# Patient Record
Sex: Female | Born: 1975 | Race: Black or African American | Hispanic: No | Marital: Single | Smoking: Never smoker
Health system: Southern US, Community
[De-identification: ages and names within clinical notes are randomized; demographics above are authoritative.]

## PROBLEM LIST (undated history)

## (undated) DIAGNOSIS — I10 Essential (primary) hypertension: Secondary | ICD-10-CM

---

## 2014-03-14 ENCOUNTER — Emergency Department (HOSPITAL_COMMUNITY)
Admission: EM | Admit: 2014-03-14 | Discharge: 2014-03-14 | Disposition: A | Discharge status: Home / Self Care | Payer: Medicaid Other | Source: Home / Self Care | Attending: Family Medicine | Admitting: Family Medicine

## 2014-03-14 ENCOUNTER — Encounter (HOSPITAL_COMMUNITY): Payer: Self-pay | Admitting: Emergency Medicine

## 2014-03-14 DIAGNOSIS — R300 Dysuria: Secondary | ICD-10-CM

## 2014-03-14 DIAGNOSIS — R030 Elevated blood-pressure reading, without diagnosis of hypertension: Secondary | ICD-10-CM

## 2014-03-14 DIAGNOSIS — IMO0001 Reserved for inherently not codable concepts without codable children: Secondary | ICD-10-CM

## 2014-03-14 LAB — POCT URINALYSIS DIP (DEVICE)
Bilirubin Urine: NEGATIVE
Glucose, UA: NEGATIVE mg/dL
KETONES UR: NEGATIVE mg/dL
LEUKOCYTES UA: NEGATIVE
Nitrite: NEGATIVE
PH: 6 (ref 5.0–8.0)
PROTEIN: NEGATIVE mg/dL
Specific Gravity, Urine: >=1.03 (ref 1.005–1.030)
UROBILINOGEN UA: 0.2 mg/dL (ref 0.0–1.0)

## 2014-03-14 MED ORDER — AMLODIPINE BESYLATE 5 MG PO TABS: 5.0000 mg | ORAL_TABLET | Freq: Every day | ORAL | Status: DC

## 2014-03-14 MED ORDER — NITROFURANTOIN MONOHYD MACRO 100 MG PO CAPS: 100.0000 mg | ORAL_CAPSULE | Freq: Two times a day (BID) | ORAL | Status: DC

## 2014-03-14 NOTE — Discharge Instructions (Signed)
Please take your blood pressure medication every day. Use medication as prescribed for your discomfort with urination. If symptoms become worse, please follow up with your doctor or return here. If urine culture indicates need for additional treatment you will be notified by phone.   Dysuria Dysuria is the medical term for pain with urination. There are many causes for dysuria, but urinary tract infection is the most common. If a urinalysis was performed it can show that there is a urinary tract infection. A urine culture confirms that you or your child is sick. You will need to follow up with a healthcare provider because:  If a urine culture was done you will need to know the culture results and treatment recommendations.  If the urine culture was positive, you or your child will need to be put on antibiotics or know if the antibiotics prescribed are the right antibiotics for your urinary tract infection.  If the urine culture is negative (no urinary tract infection), then other causes may need to be explored or antibiotics need to be stopped. Today laboratory work may have been done and there does not seem to be an infection. If cultures were done they will take at least 24 to 48 hours to be completed. Today x-rays may have been taken and they read as normal. No cause can be found for the problems. The x-rays may be re-read by a radiologist and you will be contacted if additional findings are made. You or your child may have been put on medications to help with this problem until you can see your primary caregiver. If the problems get better, see your primary caregiver if the problems return. If you were given antibiotics (medications which kill germs), take all of the mediations as directed for the full course of treatment.  If laboratory work was done, you need to find the results. Leave a telephone number where you can be reached. If this is not possible, make sure you find out how you are to get  test results. HOME CARE INSTRUCTIONS   Drink lots of fluids. For adults, drink eight, 8 ounce glasses of clear juice or water a day. For children, replace fluids as suggested by your caregiver.  Empty the bladder often. Avoid holding urine for long periods of time.  After a bowel movement, women should cleanse front to back, using each tissue only once.  Empty your bladder before and after sexual intercourse.  Take all the medicine given to you until it is gone. You may feel better in a few days, but TAKE ALL MEDICINE.  Avoid caffeine, tea, alcohol and carbonated beverages, because they tend to irritate the bladder.  In men, alcohol may irritate the prostate.  Only take over-the-counter or prescription medicines for pain, discomfort, or fever as directed by your caregiver.  If your caregiver has given you a follow-up appointment, it is very important to keep that appointment. Not keeping the appointment could result in a chronic or permanent injury, pain, and disability. If there is any problem keeping the appointment, you must call back to this facility for assistance. SEEK IMMEDIATE MEDICAL CARE IF:   Back pain develops.  A fever develops.  There is nausea (feeling sick to your stomach) or vomiting (throwing up).  Problems are no better with medications or are getting worse. MAKE SURE YOU:   Understand these instructions.  Will watch your condition.  Will get help right away if you are not doing well or get worse. Document Released:  08/13/2004 Document Revised: 02/07/2012 Document Reviewed: 06/20/2008 [facility] Patient Information 2014 [place], [place].  Hypertension As your heart beats, it forces blood through your arteries. This force is your blood pressure. If the pressure is too high, it is called hypertension (HTN) or high blood pressure. HTN is dangerous because you may have it and not know it. High blood pressure may mean that your heart has to work harder to pump  blood. Your arteries may be narrow or stiff. The extra work puts you at risk for heart disease, stroke, and other problems.  Blood pressure consists of two numbers, a higher number over a lower, 110/72, for example. It is stated as "110 over 72." The ideal is below 120 for the top number (systolic) and under 80 for the bottom (diastolic). Write down your blood pressure today. You should pay close attention to your blood pressure if you have certain conditions such as:  Heart failure.  Prior heart attack.  Diabetes  Chronic kidney disease.  Prior stroke.  Multiple risk factors for heart disease. To see if you have HTN, your blood pressure should be measured while you are seated with your arm held at the level of the heart. It should be measured at least twice. A one-time elevated blood pressure reading (especially in the Emergency Department) does not mean that you need treatment. There may be conditions in which the blood pressure is different between your right and left arms. It is important to see your caregiver soon for a recheck. Most people have essential hypertension which means that there is not a specific cause. This type of high blood pressure may be lowered by changing lifestyle factors such as:  Stress.  Smoking.  Lack of exercise.  Excessive weight.  Drug/tobacco/alcohol use.  Eating less salt. Most people do not have symptoms from high blood pressure until it has caused damage to the body. Effective treatment can often prevent, delay or reduce that damage. TREATMENT  When a cause has been identified, treatment for high blood pressure is directed at the cause. There are a large number of medications to treat HTN. These fall into several categories, and your caregiver will help you select the medicines that are best for you. Medications may have side effects. You should review side effects with your caregiver. If your blood pressure stays high after you have made lifestyle  changes or started on medicines,   Your medication(s) may need to be changed.  Other problems may need to be addressed.  Be certain you understand your prescriptions, and know how and when to take your medicine.  Be sure to follow up with your caregiver within the time frame advised (usually within two weeks) to have your blood pressure rechecked and to review your medications.  If you are taking more than one medicine to lower your blood pressure, make sure you know how and at what times they should be taken. Taking two medicines at the same time can result in blood pressure that is too low. SEEK IMMEDIATE MEDICAL CARE IF:  You develop a severe headache, blurred or changing vision, or confusion.  You have unusual weakness or numbness, or a faint feeling.  You have severe chest or abdominal pain, vomiting, or breathing problems. MAKE SURE YOU:   Understand these instructions.  Will watch your condition.  Will get help right away if you are not doing well or get worse. Document Released: 11/15/2005 Document Revised: 02/07/2012 Document Reviewed: 07/05/2008 [facility]Exit[facility] Patient Information 2014 [place], [place].

## 2014-03-14 NOTE — ED Notes (Signed)
Pt  Reports  Symptoms  Of  painfull  Urination  With  Pressure      And  Burning        Sensation     Noticed  This  Am  -    Also  Reports      A sensation of  Fullness    r  Ear

## 2014-03-14 NOTE — ED Provider Notes (Signed)
CSN: [id number]     Arrival date & time 03/14/14  1057 History   First MD Initiated Contact with Patient 03/14/14 1237     Chief Complaint  Patient presents with  . Dysuria   (Consider location/radiation/quality/duration/timing/severity/associated sxs/prior Treatment) HPI Comments: No flank pain, fever, N/V. Of note, patient reports she has been diagnosed with hypertension, but only takes her blood pressure medication sporadically. [facility]  Patient is a 38 y.o. female presenting with dysuria. The history is provided by the patient.  Dysuria Pain quality:  Burning Pain severity:  Mild Onset quality:  Gradual Duration:  1 day Timing:  Intermittent Progression:  Unchanged Chronicity:  New Recent urinary tract infections: no   Urinary symptoms: frequent urination   Urinary symptoms: no hematuria   Urinary symptoms comment:  In small amounts Associated symptoms comment:  +suprapubic pressure   History reviewed. No pertinent past medical history. History reviewed. No pertinent past surgical history. History reviewed. No pertinent family history. History  Substance Use Topics  . Smoking status: Not on file  . Smokeless tobacco: Not on file  . Alcohol Use: No   OB History   Grav Para Term Preterm Abortions TAB SAB Ect Mult Living                 Review of Systems  Genitourinary: Positive for dysuria.  All other systems reviewed and are negative.   Allergies  Review of patient's allergies indicates no known allergies.  Home Medications   Prior to Admission medications   Not on File   BP 150/96  Pulse 95  Temp(Src) 97.1 F (36.2 C) (Oral)  Resp 16  SpO2 100%  LMP 03/14/2014 Physical Exam  Nursing note and vitals reviewed. Constitutional: She is oriented to person, place, and time. She appears well-developed and well-nourished. No distress.  HENT:  Head: Normocephalic and atraumatic.  Eyes: Conjunctivae are normal. No  scleral icterus.  Cardiovascular: Normal rate, regular rhythm and normal heart sounds.   Pulmonary/Chest: Effort normal and breath sounds normal. No respiratory distress. She has no wheezes.  Abdominal: Soft. Bowel sounds are normal. She exhibits no distension. There is no tenderness. There is no CVA tenderness.  Musculoskeletal: Normal range of motion.  Neurological: She is alert and oriented to person, place, and time.  Skin: Skin is warm and dry.  Psychiatric: She has a normal mood and affect. Her behavior is normal.    ED Course  Procedures (including critical care time) Labs Review Labs Reviewed  POCT URINALYSIS DIP (DEVICE) - Abnormal; Notable for the following:    Hgb urine dipstick MODERATE (*)    All other components within normal limits  URINE CULTURE    Results for orders placed during the hospital encounter of 03/14/14  POCT URINALYSIS DIP (DEVICE)      Result Value Ref Range   Glucose, UA NEGATIVE  NEGATIVE mg/dL   Bilirubin Urine NEGATIVE  NEGATIVE   Ketones, ur NEGATIVE  NEGATIVE mg/dL   Specific Gravity, Urine >=1.030  1.005 - 1.030   Hgb urine dipstick MODERATE (*) NEGATIVE   pH 6.0  5.0 - 8.0   Protein, ur NEGATIVE  NEGATIVE mg/dL   Urobilinogen, UA 0.2  0.0 - 1.0 mg/dL   Nitrite NEGATIVE  NEGATIVE   Leukocytes, UA NEGATIVE  NEGATIVE   Imaging Review No results found.   MDM   1. Dysuria   2. Elevated blood pressure   Encouraged patient to take blood pressure  medication every day. Macrobid x  5 days for dysuria. PCP follow up if no improvement.   Jess BartersJennifer Lee SwartzPresson, GeorgiaPA 03/14/14 1309

## 2014-03-15 LAB — URINE CULTURE: Colony Count: 75000

## 2014-03-15 NOTE — ED Provider Notes (Signed)
Medical screening examination/treatment/procedure(s) were performed by a resident physician or non-physician practitioner and as the supervising physician I was immediately available for consultation/collaboration.  Gurbani Figge, MD    Shawna Kiener S Hailee Hollick, MD 03/15/14 0823 

## 2014-03-18 NOTE — ED Notes (Addendum)
Urine culture: 75,000 colonies Group B strep (S. Agalactiae).  Pt. adequately treated with Macrobid per Narda BondsLee Presson PA. Denise Brown 03/18/2014

## 2014-03-20 LAB — POCT PREGNANCY, URINE: PREG TEST UR: NEGATIVE

## 2014-03-20 NOTE — ED Notes (Signed)
Attempted  To  Call  Pt  Back        No  Answer  Pt  Advised  To  Call  Back   And  We  Would      Be  Glad  To  Talk  To  Her

## 2015-02-01 ENCOUNTER — Encounter (HOSPITAL_COMMUNITY): Payer: Self-pay | Admitting: Emergency Medicine

## 2015-02-01 ENCOUNTER — Emergency Department (HOSPITAL_COMMUNITY)
Admission: EM | Admit: 2015-02-01 | Discharge: 2015-02-02 | Disposition: A | Discharge status: Home / Self Care | Payer: Medicaid Other | Attending: Emergency Medicine | Admitting: Emergency Medicine

## 2015-02-01 DIAGNOSIS — I10 Essential (primary) hypertension: Secondary | ICD-10-CM | POA: Diagnosis not present

## 2015-02-01 DIAGNOSIS — M25551 Pain in right hip: Secondary | ICD-10-CM | POA: Insufficient documentation

## 2015-02-01 DIAGNOSIS — Z79899 Other long term (current) drug therapy: Secondary | ICD-10-CM | POA: Diagnosis not present

## 2015-02-01 DIAGNOSIS — R109 Unspecified abdominal pain: Secondary | ICD-10-CM | POA: Diagnosis not present

## 2015-02-01 DIAGNOSIS — M545 Low back pain, unspecified: Secondary | ICD-10-CM

## 2015-02-01 DIAGNOSIS — M25552 Pain in left hip: Secondary | ICD-10-CM | POA: Diagnosis not present

## 2015-02-01 DIAGNOSIS — M549 Dorsalgia, unspecified: Secondary | ICD-10-CM | POA: Diagnosis present

## 2015-02-01 HISTORY — DX: Essential (primary) hypertension: I10

## 2015-02-01 NOTE — ED Provider Notes (Signed)
CSN: [id number]     Arrival date & time 02/01/15  2117 History   First MD Initiated Contact with Patient 02/01/15 2246     This chart was scribed for non-physician practitioner working with Flint Melter, MD by Arlan Organ, ED Scribe. This patient was seen in room WTR6/WTR6 and the patient's care was started at 11:10 PM.   Chief Complaint  Patient presents with  . Hip Pain    bilateral   The history is provided by the patient. No language interpreter was used.    HPI Comments: Federica Allport is a 39 y.o. female with a PMHx of HTN who presents to the Emergency Department complaining of constant, moderate bilateral hip pain; R greater than L x 4 days after waking from sleep. Pt states she attributes pain to carrying her daughter on her back for approximately 5 minutes earlier this week while at the park. Pain is exacerbated with extended repetitive activity, bending over, and ambulation. She has not tried any OTC medications to help manage symptoms. However, she has tried topical icey hot to the area without any relief. Pt also reports back pain onset today, mild intermittent abdominal pain onset 2 days, mild urinary frequency onset 2 days, and back pain x 4 days. LNMP 2/10. She was prescribed Macrobid earlier today for UTI. Ms. Molla denies any fever, chills, SOB, CP, dysuria. Pt with known allergy to Macrobid.  Past Medical History  Diagnosis Date  . Hypertension    Past Surgical History  Procedure Laterality Date  . Cesarean section     No family history on file. History  Substance Use Topics  . Smoking status: Not on file  . Smokeless tobacco: Not on file  . Alcohol Use: No   OB History    No data available     Review of Systems  Constitutional: Negative for fever and chills.  Respiratory: Negative for shortness of breath.   Cardiovascular: Negative for chest pain.  Gastrointestinal: Positive for abdominal pain. Negative for nausea and vomiting.  Genitourinary: Positive for  frequency.  Musculoskeletal: Positive for back pain and arthralgias.      Allergies  Macrobid  Home Medications   Prior to Admission medications   Medication Sig Start Date End Date Taking? Authorizing Provider  amLODipine (NORVASC) 5 MG tablet Take 1 tablet (5 mg total) by mouth daily. 03/14/14   Ria Clock, PA  nitrofurantoin, macrocrystal-monohydrate, (MACROBID) 100 MG capsule Take 1 capsule (100 mg total) by mouth 2 (two) times daily. 03/14/14   Ria Clock, PA   Triage Vitals: BP 155/102 mmHg  Pulse 84  Temp(Src) 98.4 F (36.9 C) (Oral)  Resp 20  Wt 215 lb (97.523 kg)  SpO2 100%  LMP 01/08/2015 (Approximate)   Physical Exam  Constitutional: She is oriented to person, place, and time. She appears well-developed and well-nourished.  HENT:  Head: Normocephalic.  Eyes: EOM are normal.  Neck: Normal range of motion.  Cardiovascular: Intact distal pulses.   Pulmonary/Chest: Effort normal.  Abdominal: She exhibits no distension.  Musculoskeletal: Normal range of motion.  Tender paralumbar back. No swelling FROM LE's without weakness or limitation. Hips non-tender. No redness or warmth.   Neurological: She is alert and oriented to person, place, and time.  Skin: Skin is warm and dry. No erythema.  Psychiatric: She has a normal mood and affect.  Nursing note and vitals reviewed.   ED Course  Procedures (including critical care time)  DIAGNOSTIC STUDIES: Oxygen Saturation  is 100% on RA, Normal by my interpretation.    COORDINATION OF CARE: 11:09 PM-Discussed treatment plan with pt at bedside and pt agreed to plan.     Labs Review Labs Reviewed - No data to display  Imaging Review No results found.   EKG Interpretation None      MDM   Final diagnoses:  None    1. Low back pain  No neurologic deficits associated with low back pain. Will prescribe ibuprofen, flexeril. Encouraged her to take Macrobid as prescribed.   I personally  performed the services described in this documentation, which was scribed in my presence. The recorded information has been reviewed and is accurate.     Elpidio AnisShari Yajaira Doffing, PA-C 02/10/15 2050  Mancel BaleElliott Wentz, MD 02/11/15 0040

## 2015-02-01 NOTE — ED Notes (Signed)
Patient c/o bilateral hip pain. Patient states the only abnormal incident she can recall is carrying her child on her back for @ 5 minutes on Monday. Patient states pain started Monday. Any extended repetitive activity increases pain. Patient states she has not taken anything for pain PTA. Patient states it feels like a bruise but there is no markings, states the pain shoots down right leg.

## 2015-02-02 MED ORDER — CYCLOBENZAPRINE HCL 10 MG PO TABS: 10.0000 mg | ORAL_TABLET | Freq: Two times a day (BID) | ORAL | Status: DC | PRN

## 2015-02-02 MED ORDER — IBUPROFEN 800 MG PO TABS
800.0000 mg | ORAL_TABLET | Freq: Once | ORAL | Status: AC
  Administered 2015-02-02: 800 mg via ORAL
  Filled 2015-02-02: qty 1

## 2015-02-02 MED ORDER — IBUPROFEN 800 MG PO TABS: 800.0000 mg | ORAL_TABLET | Freq: Three times a day (TID) | ORAL | Status: DC

## 2015-02-02 NOTE — Discharge Instructions (Signed)
Back Pain, Adult °Low back pain is very common. About 1 in 5 people have back pain. The cause of low back pain is rarely dangerous. The pain often gets better over time. About half of people with a sudden onset of back pain feel better in just 2 weeks. About 8 in 10 people feel better by 6 weeks.  °CAUSES °Some common causes of back pain include: °· Strain of the muscles or ligaments supporting the spine. °· Wear and tear (degeneration) of the spinal discs. °· Arthritis. °· Direct injury to the back. °DIAGNOSIS °Most of the time, the direct cause of low back pain is not known. However, back pain can be treated effectively even when the exact cause of the pain is unknown. Answering your caregiver's questions about your overall health and symptoms is one of the most accurate ways to make sure the cause of your pain is not dangerous. If your caregiver needs more information, he or she may order lab work or imaging tests (X-rays or MRIs). However, even if imaging tests show changes in your back, this usually does not require surgery. °HOME CARE INSTRUCTIONS °For many people, back pain returns. Since low back pain is rarely dangerous, it is often a condition that people can learn to manage on their own.  °· Remain active. It is stressful on the back to sit or stand in one place. Do not sit, drive, or stand in one place for more than 30 minutes at a time. Take short walks on level surfaces as soon as pain allows. Try to increase the length of time you walk each day. °· Do not stay in bed. Resting more than 1 or 2 days can delay your recovery. °· Do not avoid exercise or work. Your body is made to move. It is not dangerous to be active, even though your back may hurt. Your back will likely heal faster if you return to being active before your pain is gone. °· Pay attention to your body when you  bend and lift. Many people have less discomfort when lifting if they bend their knees, keep the load close to their bodies, and  avoid twisting. Often, the most comfortable positions are those that put less stress on your recovering back. °· Find a comfortable position to sleep. Use a firm mattress and lie on your side with your knees slightly bent. If you lie on your back, put a pillow under your knees. °· Only take over-the-counter or prescription medicines as directed by your caregiver. Over-the-counter medicines to reduce pain and inflammation are often the most helpful. Your caregiver may prescribe muscle relaxant drugs. These medicines help dull your pain so you can more quickly return to your normal activities and healthy exercise. °· Put ice on the injured area. °¨ Put ice in a plastic bag. °¨ Place a towel between your skin and the bag. °¨ Leave the ice on for 15-20 minutes, 03-04 times a day for the first 2 to 3 days. After that, ice and heat may be alternated to reduce pain and spasms. °· Ask your caregiver about trying back exercises and gentle massage. This may be of some benefit. °· Avoid feeling anxious or stressed. Stress increases muscle tension and can worsen back pain. It is important to recognize when you are anxious or stressed and learn ways to manage it. Exercise is a great option. °SEEK MEDICAL CARE IF: °· You have pain that is not relieved with rest or medicine. °· You have pain that does not improve in 1 week. °· You have new symptoms. °· You are generally not feeling well. °SEEK   IMMEDIATE MEDICAL CARE IF:   You have pain that radiates from your back into your legs.  You develop new bowel or bladder control problems.  You have unusual weakness or numbness in your arms or legs.  You develop nausea or vomiting.  You develop abdominal pain.  You feel faint. Document Released: 11/15/2005 Document Revised: 05/16/2012 Document Reviewed: 03/19/2014 [facility] Patient Information 2015 [place], [place]. This information is not intended to replace advice given to you by your health care provider. Make sure you  discuss any questions you have with your health care provider. Cryotherapy Cryotherapy means treatment with cold. Ice or gel packs can be used to reduce both pain and swelling. Ice is the most helpful within the first 24 to 48 hours after an injury or flare-up from overusing a muscle or joint. Sprains, strains, spasms, burning pain, shooting pain, and aches can all be eased with ice. Ice can also be used when recovering from surgery. Ice is effective, has very few side effects, and is safe for most people to use. PRECAUTIONS  Ice is not a safe treatment option for people with:  Raynaud phenomenon. This is a condition affecting small blood vessels in the extremities. Exposure to cold may cause your problems to return.  Cold hypersensitivity. There are many forms of cold hypersensitivity, including:  Cold urticaria. Red, itchy hives appear on the skin when the tissues begin to warm after being iced.  Cold erythema. This is a red, itchy rash caused by exposure to cold.  Cold hemoglobinuria. Red blood cells break down when the tissues begin to warm after being iced. The hemoglobin that carry oxygen are passed into the urine because they cannot combine with blood proteins fast enough.  Numbness or altered sensitivity in the area being iced. If you have any of the following conditions, do not use ice until you have discussed cryotherapy with your caregiver:  Heart conditions, such as arrhythmia, angina, or chronic heart disease.  High blood pressure.  Healing wounds or open skin in the area being iced.  Current infections.  Rheumatoid arthritis.  Poor circulation.  Diabetes. Ice slows the blood flow in the region it is applied. This is beneficial when trying to stop inflamed tissues from spreading irritating chemicals to surrounding tissues. However, if you expose your skin to cold temperatures for too long or without the proper protection, you can damage your skin or nerves. Watch for  signs of skin damage due to cold. HOME CARE INSTRUCTIONS Follow these tips to use ice and cold packs safely.  Place a dry or damp towel between the ice and skin. A damp towel will cool the skin more quickly, so you may need to shorten the time that the ice is used.  For a more rapid response, add gentle compression to the ice.  Ice for no more than 10 to 20 minutes at a time. The bonier the area you are icing, the less time it will take to get the benefits of ice.  Check your skin after 5 minutes to make sure there are no signs of a poor response to cold or skin damage.  Rest 20 minutes or more between uses.  Once your skin is numb, you can end your treatment. You can test numbness by very lightly touching your skin. The touch should be so light that you do not see the skin dimple from the pressure of your fingertip. When using ice, most people will feel these normal sensations in this order: cold,  burning, aching, and numbness.  Do not use ice on someone who cannot communicate their responses to pain, such as small children or people with dementia. HOW TO MAKE AN ICE PACK Ice packs are the most common way to use ice therapy. Other methods include ice massage, ice baths, and cryosprays. Muscle creams that cause a cold, tingly feeling do not offer the same benefits that ice offers and should not be used as a substitute unless recommended by your caregiver. To make an ice pack, do one of the following:  Place crushed ice or a bag of frozen vegetables in a sealable plastic bag. Squeeze out the excess air. Place this bag inside another plastic bag. Slide the bag into a pillowcase or place a damp towel between your skin and the bag.  Mix 3 parts water with 1 part rubbing alcohol. Freeze the mixture in a sealable plastic bag. When you remove the mixture from the freezer, it will be slushy. Squeeze out the excess air. Place this bag inside another plastic bag. Slide the bag into a pillowcase or place  a damp towel between your skin and the bag. SEEK MEDICAL CARE IF:  You develop white spots on your skin. This may give the skin a blotchy (mottled) appearance.  Your skin turns blue or pale.  Your skin becomes waxy or hard.  Your swelling gets worse. MAKE SURE YOU:   Understand these instructions.  Will watch your condition.  Will get help right away if you are not doing well or get worse. Document Released: 07/12/2011 Document Revised: 04/01/2014 Document Reviewed: 07/12/2011 [facility] Patient Information 2015 [place]. This information is not intended to replace advice given to you by your health care provider. Make sure you discuss any questions you have with your health care provider. Heat Therapy Heat therapy can help ease sore, stiff, injured, and tight muscles and joints. Heat relaxes your muscles, which may help ease your pain.  RISKS AND COMPLICATIONS If you have any of the following conditions, do not use heat therapy unless your health care provider has approved:  Poor circulation.  Healing wounds or scarred skin in the area being treated.  Diabetes, heart disease, or high blood pressure.  Not being able to feel (numbness) the area being treated.  Unusual swelling of the area being treated.  Active infections.  Blood clots.  Cancer.  Inability to communicate pain. This may include young children and people who have problems with their brain function (dementia).  Pregnancy. Heat therapy should only be used on old, pre-existing, or long-lasting (chronic) injuries. Do not use heat therapy on new injuries unless directed by your health care provider. HOW TO USE HEAT THERAPY There are several different kinds of heat therapy, including:  Moist heat pack.  Warm water bath.  Hot water bottle.  Electric heating pad.  Heated gel pack.  Heated wrap.  Electric heating pad. Use the heat therapy method suggested by your health care provider. Follow your  health care provider's instructions on when and how to use heat therapy. GENERAL HEAT THERAPY RECOMMENDATIONS  Do not sleep while using heat therapy. Only use heat therapy while you are awake.  Your skin may turn pink while using heat therapy. Do not use heat therapy if your skin turns red.  Do not use heat therapy if you have new pain.  High heat or long exposure to heat can cause burns. Be careful when using heat therapy to avoid burning your skin.  Do not use  heat therapy on areas of your skin that are already irritated, such as with a rash or sunburn. SEEK MEDICAL CARE IF:  You have blisters, redness, swelling, or numbness.  You have new pain.  Your pain is worse. MAKE SURE YOU:  Understand these instructions.  Will watch your condition.  Will get help right away if you are not doing well or get worse. Document Released: 02/07/2012 Document Revised: 04/01/2014 Document Reviewed: 01/08/2014 [facility] Patient Information 2015 [place], [place]. This information is not intended to replace advice given to you by your health care provider. Make sure you discuss any questions you have with your health care provider.

## 2015-09-16 ENCOUNTER — Emergency Department (HOSPITAL_COMMUNITY): Payer: Medicaid Other

## 2015-09-16 ENCOUNTER — Emergency Department (HOSPITAL_COMMUNITY)
Admission: EM | Admit: 2015-09-16 | Discharge: 2015-09-16 | Disposition: A | Discharge status: Home / Self Care | Payer: Medicaid Other | Attending: Emergency Medicine | Admitting: Emergency Medicine

## 2015-09-16 ENCOUNTER — Encounter (HOSPITAL_COMMUNITY): Payer: Self-pay | Admitting: Emergency Medicine

## 2015-09-16 DIAGNOSIS — R079 Chest pain, unspecified: Secondary | ICD-10-CM

## 2015-09-16 DIAGNOSIS — I10 Essential (primary) hypertension: Secondary | ICD-10-CM | POA: Insufficient documentation

## 2015-09-16 DIAGNOSIS — M546 Pain in thoracic spine: Secondary | ICD-10-CM | POA: Insufficient documentation

## 2015-09-16 LAB — CBC WITH DIFFERENTIAL/PLATELET
BASOS PCT: 0 %
Basophils Absolute: 0 10*3/uL (ref 0.0–0.1)
Eosinophils Absolute: 0.2 10*3/uL (ref 0.0–0.7)
Eosinophils Relative: 6 %
HEMATOCRIT: 29.2 % — AB (ref 36.0–46.0)
HEMOGLOBIN: 9 g/dL — AB (ref 12.0–15.0)
Lymphocytes Relative: 44 %
Lymphs Abs: 1.2 10*3/uL (ref 0.7–4.0)
MCH: 23.6 pg — AB (ref 26.0–34.0)
MCHC: 30.8 g/dL (ref 30.0–36.0)
MCV: 76.6 fL — ABNORMAL LOW (ref 78.0–100.0)
MONO ABS: 0.3 10*3/uL (ref 0.1–1.0)
MONOS PCT: 11 %
NEUTROS ABS: 1.1 10*3/uL — AB (ref 1.7–7.7)
Neutrophils Relative %: 39 %
PLATELETS: 311 10*3/uL (ref 150–400)
RBC: 3.81 MIL/uL — ABNORMAL LOW (ref 3.87–5.11)
RDW: 16.1 % — ABNORMAL HIGH (ref 11.5–15.5)
WBC: 2.8 10*3/uL — ABNORMAL LOW (ref 4.0–10.5)

## 2015-09-16 LAB — BASIC METABOLIC PANEL
Anion gap: 5 (ref 5–15)
BUN: 7 mg/dL (ref 6–20)
CHLORIDE: 110 mmol/L (ref 101–111)
CO2: 25 mmol/L (ref 22–32)
CREATININE: 0.56 mg/dL (ref 0.44–1.00)
Calcium: 8.9 mg/dL (ref 8.9–10.3)
GFR calc Af Amer: >60 mL/min (ref >60)
GFR calc non Af Amer: >60 mL/min (ref >60)
Glucose, Bld: 95 mg/dL (ref 65–99)
Potassium: 3.9 mmol/L (ref 3.5–5.1)
Sodium: 140 mmol/L (ref 135–145)

## 2015-09-16 LAB — I-STAT TROPONIN, ED: TROPONIN I, POC: 0 ng/mL (ref 0.00–0.08)

## 2015-09-16 MED ORDER — ASPIRIN 81 MG PO CHEW
324.0000 mg | CHEWABLE_TABLET | Freq: Once | ORAL | Status: AC
  Administered 2015-09-16: 324 mg via ORAL
  Filled 2015-09-16: qty 4

## 2015-09-16 NOTE — ED Notes (Addendum)
Pt c/o mid-sternal chest pain and back pain x 1day.  Pt describes pain as dull ache.  Pt states pain worsens with movement and stretching.  Pt denies nausea, vomiting, SHOB, diaphoresis.  Pt states she did a lot of heavy lifting over the weekend.  Pt has not taken any pain medication because she was unsure what to take.

## 2015-09-16 NOTE — ED Provider Notes (Signed)
CSN: [id number]     Arrival date & time 09/16/15  0719 History   First MD Initiated Contact with Patient 09/16/15 0732     Chief Complaint  Patient presents with  . Back Pain     (Consider location/radiation/quality/duration/timing/severity/associated sxs/prior Treatment) HPI Comments: 39yo F who presents with chest pain. The patient states that over the weekend, she was carrying furniture. She noticed arm pain the next day but states that yesterday she began having a dull, achy, constant central to left-sided chest pain as well as pain in her upper back. The pain was never sudden or ripping/tearing in onset. The pain is worse with movement and stretching. She denies any associated nausea, vomiting, diaphoresis, or shortness of breath. She denies any cough/cold symptoms, fevers, or recent illness. She has not yet taken any medication. No personal or family history of blood clots. No recent travel, OCP use, or history of cancer.  Family history negative for early heart disease.  Patient is a 39 y.o. female presenting with back pain. The history is provided by the patient.  Back Pain   Past Medical History  Diagnosis Date  . Hypertension    Past Surgical History  Procedure Laterality Date  . Cesarean section     No family history on file. Social History  Substance Use Topics  . Smoking status: Never Smoker   . Smokeless tobacco: None  . Alcohol Use: No   OB History    No data available     Review of Systems  Musculoskeletal: Positive for back pain.    10 Systems reviewed and are negative for acute change except as noted in the HPI.   Allergies  Macrobid  Home Medications   Prior to Admission medications   Medication Sig Start Date End Date Taking? Authorizing Provider  cetirizine (ZYRTEC) 10 MG tablet Take 10 mg by mouth once.   Yes Historical Provider, MD  ibuprofen (ADVIL,MOTRIN) 800 MG tablet Take 1 tablet (800 mg total) by mouth 3 (three) times daily. Patient  taking differently: Take 400 mg by mouth 3 (three) times daily as needed for fever, headache, mild pain, moderate pain or cramping.  02/02/15  Yes Shari Upstill, PA-C  amLODipine (NORVASC) 5 MG tablet Take 1 tablet (5 mg total) by mouth daily. Patient not taking: Reported on 02/01/2015 03/14/14   Ria Clock, PA  cyclobenzaprine (FLEXERIL) 10 MG tablet Take 1 tablet (10 mg total) by mouth 2 (two) times daily as needed for muscle spasms. Patient not taking: Reported on 09/16/2015 02/02/15   Elpidio Anis, PA-C  nitrofurantoin, macrocrystal-monohydrate, (MACROBID) 100 MG capsule Take 1 capsule (100 mg total) by mouth 2 (two) times daily. Patient not taking: Reported on 02/01/2015 03/14/14   Jess Barters H Presson, PA   BP 122/69 mmHg  Pulse 65  Temp(Src) 98.3 F (36.8 C) (Oral)  Resp 16  SpO2 100%  LMP 09/16/2015 Physical Exam  Constitutional: She is oriented to person, place, and time. She appears well-developed and well-nourished. No distress.  HENT:  Head: Normocephalic and atraumatic.  Moist mucous membranes  Eyes: Conjunctivae are normal. Pupils are equal, round, and reactive to light.  Neck: Neck supple.  Cardiovascular: Normal rate, regular rhythm and normal heart sounds.   No murmur heard. Pulmonary/Chest: Effort normal and breath sounds normal.  Abdominal: Soft. Bowel sounds are normal. She exhibits no distension. There is no tenderness.  Musculoskeletal: She exhibits no edema.  Neurological: She is alert and oriented to person, place, and time.  Fluent speech  Skin: Skin is warm and dry.  Psychiatric: She has a normal mood and affect. Judgment normal.  pleasant  Nursing note and vitals reviewed.   ED Course  Procedures (including critical care time) Labs Review Labs Reviewed  CBC WITH DIFFERENTIAL/PLATELET - Abnormal; Notable for the following:    WBC 2.8 (*)    RBC 3.81 (*)    Hemoglobin 9.0 (*)    HCT 29.2 (*)    MCV 76.6 (*)    MCH 23.6 (*)    RDW 16.1 (*)     Neutro Abs 1.1 (*)    All other components within normal limits  BASIC METABOLIC PANEL  I-STAT TROPOININ, ED    Imaging Review Dg Chest 2 View  09/16/2015  CLINICAL DATA:  Chest and back pain EXAM: CHEST  2 VIEW COMPARISON:  None. FINDINGS: Cardiac enlargement. Negative for heart failure. Aortic contour normal. Lungs are clear without infiltrate or effusion IMPRESSION: Cardiac enlargement.  No acute cardiopulmonary abnormality. Electronically Signed   By: Marlan Palauharles  Clark M.D.   On: 09/16/2015 08:27   I have personally reviewed and evaluated these lab results as part of my medical decision-making.   EKG Interpretation   Date/Time:  Tuesday September 16 2015 08:52:23 EDT Ventricular Rate:  61 PR Interval:  142 QRS Duration: 73 QT Interval:  464 QTC Calculation: 467 R Axis:   38 Text Interpretation:  Sinus rhythm EKG WITHIN NORMAL LIMITS Confirmed by  LITTLE MD, RACHEL ([id number]) on 09/16/2015 10:29:55 AM     Medications  aspirin chewable tablet 324 mg (324 mg Oral Given 09/16/15 0841)    MDM   Final diagnoses:  Chest pain, unspecified chest pain type    39 year old female who presents with 1 day of constant, dull chest pain and upper back pain in the setting of recent heavy lifting. Patient well-appearing at presentation. Vital signs notable for hypertension at 149/72. EKG unremarkable with no ischemic changes. Obtained chest x-ray which was normal. Troponin negative. Labwork notable for anemia at hemoglobin 9 but otherwise unremarkable. Patient is PERC negative thus PE very unlikely. Given recent lifting, I suspect a musculoskeletal etiology of her pain as it is worse with movement and constant, dull in nature. Instructed on supportive care including NSAIDs and early range of motion. Instructed patient to follow-up with PCP as a concern that she may have essential hypertension; she has strong family history of hypertension. Provided patient with resources list for outpatient  providers. Return precautions regarding her chest pain were reviewed and patient voiced understanding. Patient discharged in satisfactory condition.  Laurence Spatesachel Morgan Little, MD 09/16/15 [phone]

## 2015-09-16 NOTE — Discharge Instructions (Signed)
Emergency Department Resource Guide °1) Find a Doctor and Pay Out of Pocket °Although you won't have to find out who is covered by your insurance plan, it is a good idea to ask around and get recommendations. You will then need to call the office and see if the doctor you have chosen will accept you as a new patient and what types of options they offer for patients who are self-pay. Some doctors offer discounts or will set up payment plans for their patients who do not have insurance, but you will need to ask so you aren't surprised when you get to your appointment. ° °2) Contact Your Local Health Department °Not all health departments have doctors that can see patients for sick visits, but many do, so it is worth a call to see if yours does. If you don't know where your local health department is, you can check in your phone book. The CDC also has a tool to help you locate your state's health department, and many state websites also have listings of all of their local health departments. ° °3) Find a Walk-in Clinic °If your illness is not likely to be very severe or complicated, you may want to try a walk in clinic. These are popping up all over the country in pharmacies, drugstores, and shopping centers. They're usually staffed by nurse practitioners or physician assistants that have been trained to treat common illnesses and complaints. They're usually fairly quick and inexpensive. However, if you have serious medical issues or chronic medical problems, these are probably not your best option. ° °No Primary Care Doctor: °- Call [facility] at  832-8000 - they can help you locate a primary care doctor that  accepts your insurance, provides certain services, etc. °- Physician Referral Service- 1-800-533-3463 ° °Chronic Pain Problems: °Organization         Address  Phone   Notes  °[facility]  ([phone]) [phone] Patients need to be referred by their primary care doctor.  ° °Medication  Assistance: °Organization         Address  Phone   Notes  °[place] [facility] [address] °[place], [place] [address] (336) 641-8030 --Must be a resident of [place] °-- Must have NO insurance coverage whatsoever (no Medicaid/ Medicare, etc.) °-- The pt. MUST have a primary care doctor that directs their care regularly and follows them in the community °  °[facility]  (866) 331-1348   °[employer]  (888) 892-1162   ° °Agencies that provide inexpensive medical care: °Organization         Address                                                       Phone                                                                            Notes  °[facility]  (336) 832-8035   ° Internal Medicine    (336)   832-7272   °[facility] [address] °[place], [place] [address] (336) 832-4777   °[facility] [address], °[place] (336) 271-4999   °[facility]    (336) 373-0678   °[facility]    (336) 272-1050   °[facility] ° [address], [place] Phone:  (336) 832-4444, Fax:  (336) 832-4440 Hours of Operation:  9 am - 6 pm, M-F.  Also accepts Medicaid/Medicare and self-pay.  °[facility] ° [address], [place] Phone: (336) [phone], Fax: (336) [phone]. Hours of Operation:  8:30 am - 5:30 pm, M-F.  Also accepts Medicaid and self-pay.  °[facility] [place] [address], [place] Phone: (336) [phone]   °[facility] [address], [place], [place] (336)723-1848, Ext. 123 Mondays & Thursdays: 7-9 AM.  First 15 patients are seen on a first come, first serve basis. °  ° °Medicaid-accepting [place] Providers: ° °Organization         Address                                                                       Phone                               Notes  °[facility] [address], [place] (336) [phone] Also accepts self-pay patients.  °[facility] [address], [place] ° (336) [phone]   °[facility] [address], [place] (336) [phone]   °[facility] [address] [place] [address], [place] (336) 299-7000   °[facility] [address], [place]  ° (336) [phone] Only accepts [place] Access Medicaid patients after they have their name applied to their card.  ° °Self-Pay (no insurance) in [place]: °  °Organization         Address                                                     Phone               Notes  °Sickle Cell Patients, [facility] [address], [place] (336) [phone]   °[facility] [address], [place] (336) [phone]   °[facility] ° [address], [place] (336) [phone]   °[facility]/Dr. Osei-Bonsu ° [address] [place] [address], [place] or [address], [place] (336) [phone] Phone number for both [place] and [place] locations is the same.  °[facility] [address], [place] (336) [phone]   °[facility] [address] [place] [address], [place] or [address] (336) 852-7530 °(336) 878-2260   °[facility] [address],  (336) 350-1642, phone; (336) [phone], fax Sees patients 1st and 3rd Saturday of   every month.  Must not qualify for public or private insurance (i.e. Medicaid, Medicare, [place] Health Choice, Veterans' Benefits) • Household income should be no more than 200% of the poverty level •The clinic cannot treat you if you are pregnant or think you are pregnant • Sexually transmitted diseases are not treated at the clinic.  ° °_____________Dental Care:______________ °Organization         Address                                  Phone                       Notes  °[place]  [facility] [address], [place] (336) 641-6152 Accepts children up to age 21 who are enrolled in Medicaid or [place] Health Choice; pregnant women with a Medicaid card; and children who have applied for Medicaid or [place] Health Choice, but were declined, whose parents can pay a reduced fee at time of service.  °[place] [facility] [place]  [address], [place] (336) 641-7733 Accepts children up to age 21 who are enrolled in Medicaid or [place] Health Choice; pregnant women with a Medicaid card; and children who have applied for Medicaid or [place] Health Choice, but were declined, whose parents can pay a reduced fee at time of service.  °[facility] ° [address], [place] (336) [phone] Patients are seen by appointment only. Walk-ins are not accepted. [facility] will see patients 18 years of age and older. °Monday - Tuesday (8am-5pm) °Most Wednesdays (8:30-5pm) °$30 per visit, cash only  °[facility] ° [address], [place] (336) [phone] Patients are seen by appointment only. Walk-ins are not accepted. [facility] will see patients 18 years of age and older. °One Wednesday Evening (Monthly: Volunteer Based).  $30 per visit, cash only  °[facility]  (919) [phone] for adults; Children under age 4, call Graduate Pediatric Dentistry at ([phone]) [phone]. Children aged 4-14, please call ([phone]) [phone] to request a pediatric application. ° Dental services are provided in all areas of dental care including fillings, crowns and bridges, complete and partial dentures, implants, gum treatment, root canals, and extractions. Preventive care is also provided. Treatment is provided to both adults and children. °Patients are selected via a lottery and there is often a waiting list. °  °[facility] [address], ° ° (336) 763-8833  [email or web address].[facility].com °  °[facility] [address], [place], [place] (336)723-1848, Ext. 123 Second and Fourth Thursday of each month, opens at 6:30 AM; Clinic ends at 9 AM.  Patients are seen on a first-come first-served basis, and a limited number are seen during each clinic.  ° °[facility] ° [address], [place],  ([phone]) [phone]   Eligibility Requirements °You must have lived in [place], [place], or [place] counties for at least the last three months. °  You cannot be eligible for state or federal sponsored healthcare insurance, including Veterans Administration, Medicaid, or Medicare. °  You generally cannot be eligible for healthcare insurance through your employer.  °  How to apply: °Eligibility screenings are held every Tuesday and Wednesday afternoon from 1:00 pm until 4:00 pm. You do not need an appointment for the interview!  °  [facility] [address], [place], [place] [phone]   °[facility]  [phone]   °[facility]  [phone]   °[facility]  [phone]   ° °

## 2015-09-16 NOTE — ED Notes (Signed)
Per pt, states epigastric pain that radiates to the back-moved furniture last week, might be related

## 2016-03-21 IMAGING — CR DG CHEST 2V
2 series · 2 of 2 positions shown · non-contrast
Comparison: None.

CLINICAL DATA: Chest and back pain

EXAM:
CHEST  2 VIEW

[w chest pa]
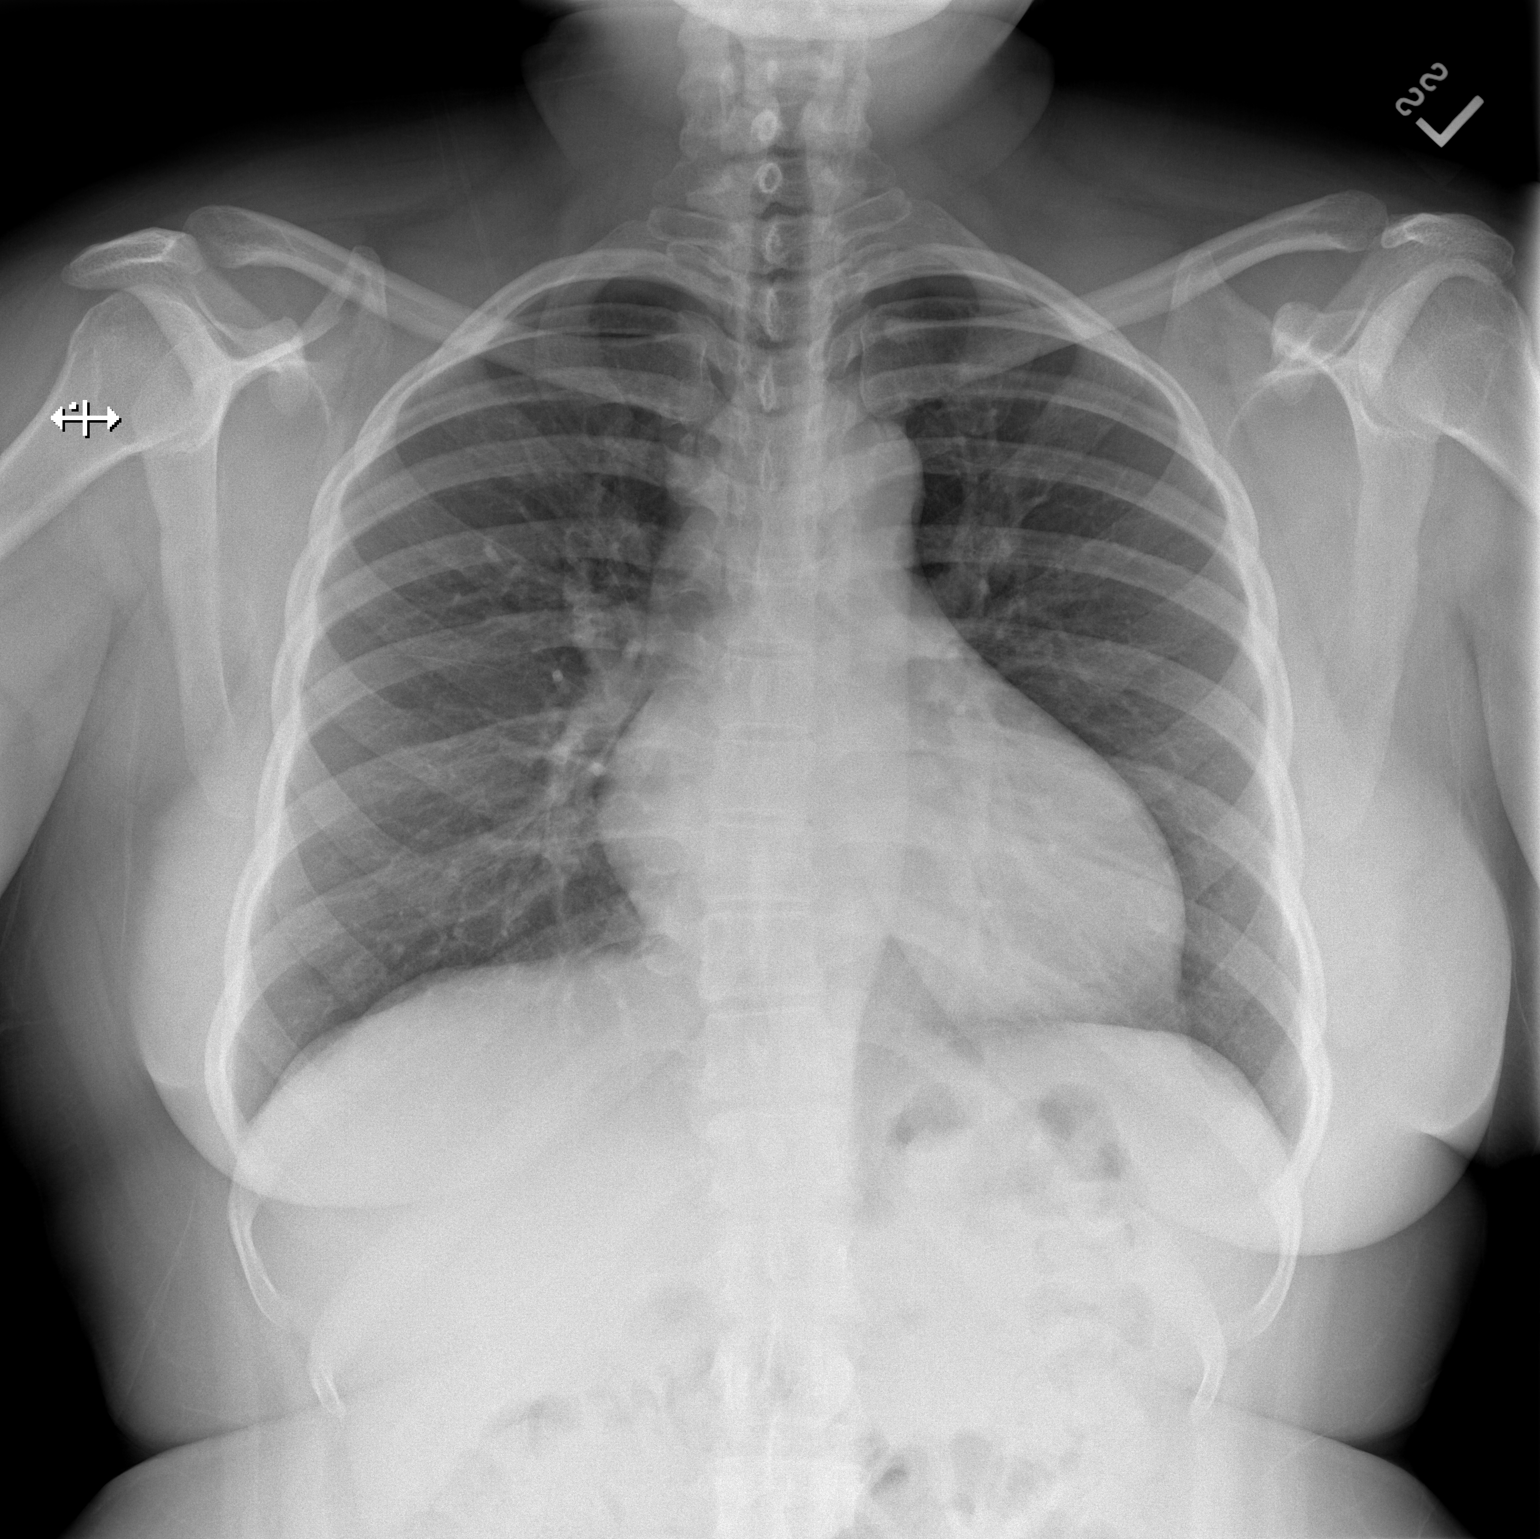

[w chest lat]
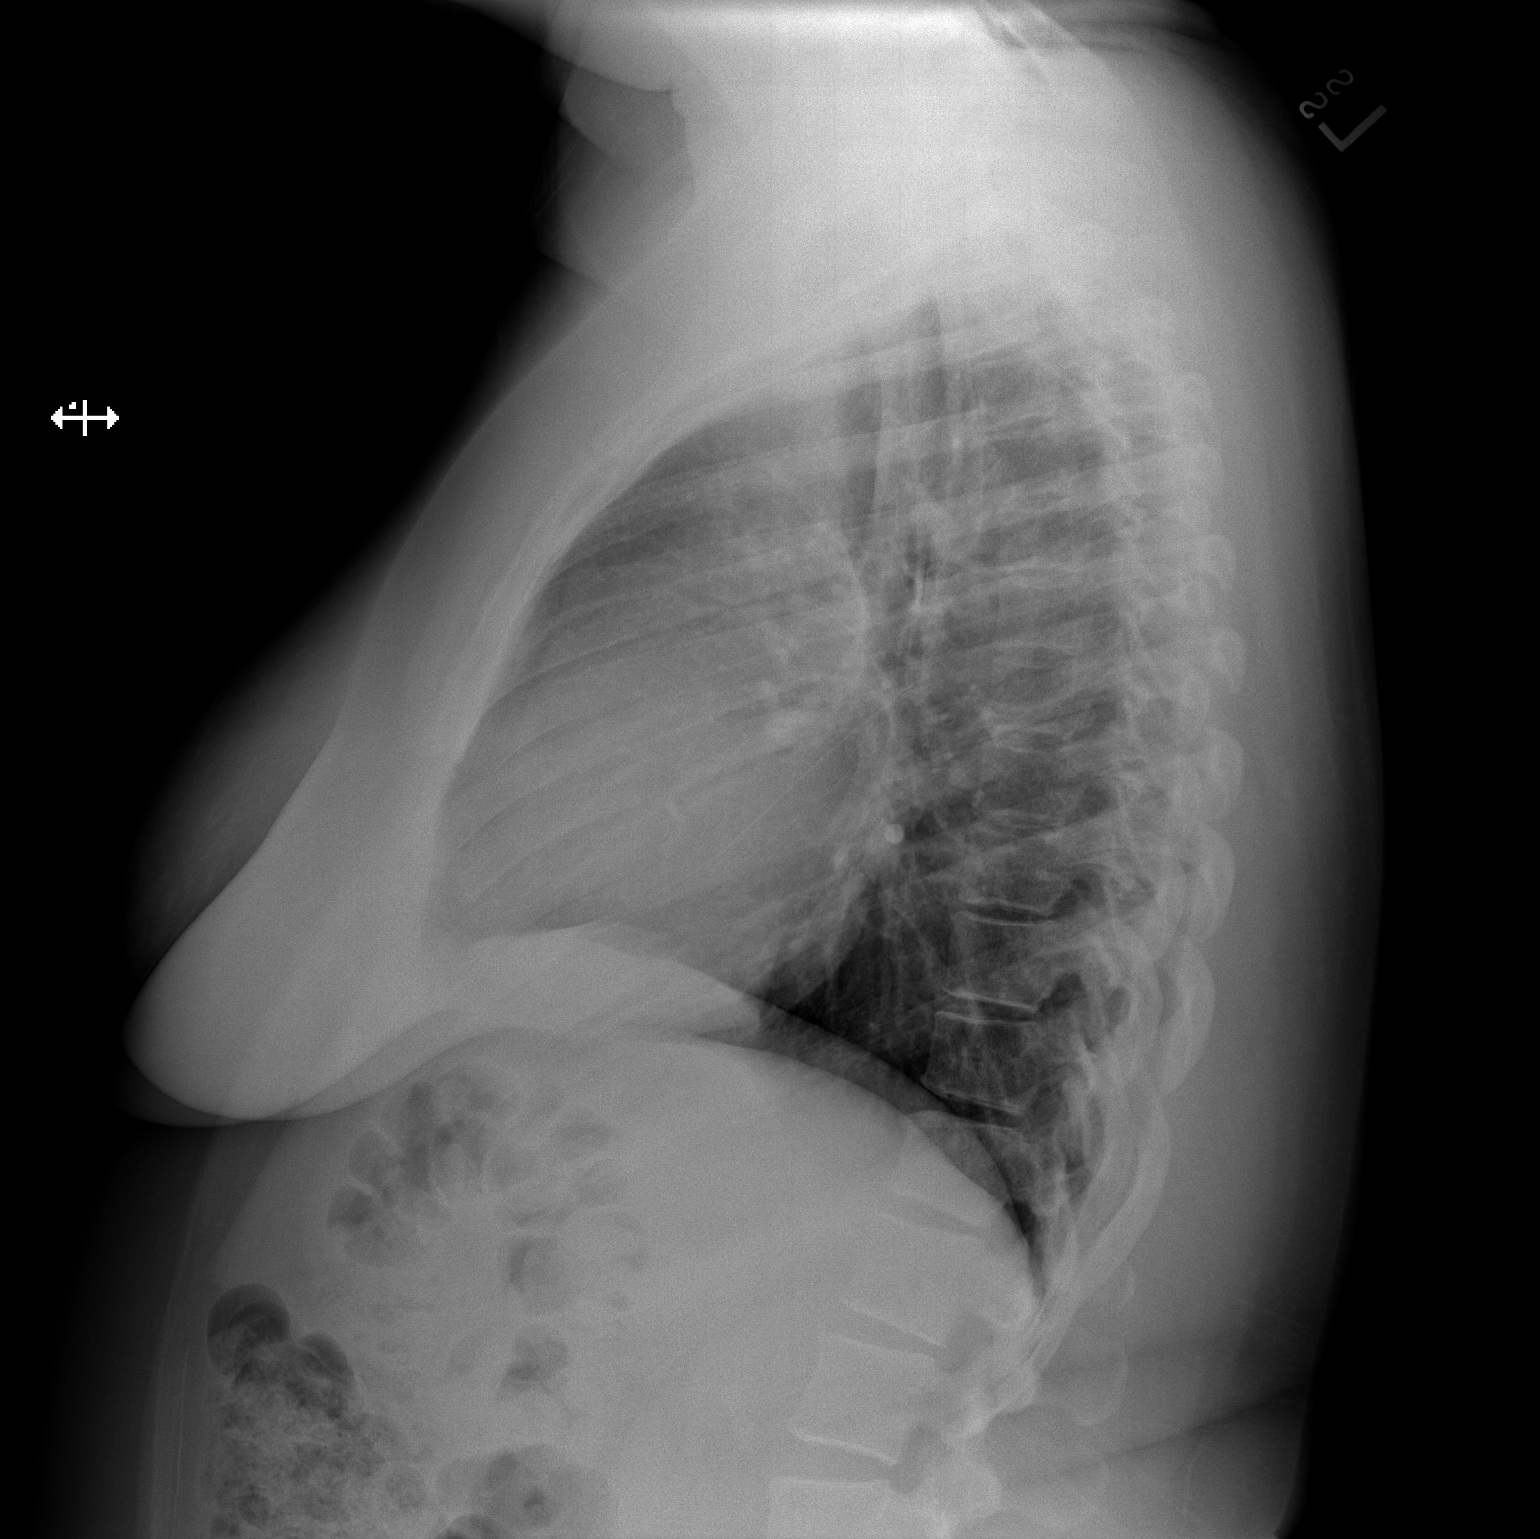

[2 of 2 positions shown; findings below may reference images not displayed]

FINDINGS: Cardiac enlargement. Negative for heart failure. Aortic contour
normal. Lungs are clear without infiltrate or effusion
IMPRESSION: Cardiac enlargement.  No acute cardiopulmonary abnormality.

## 2016-04-03 ENCOUNTER — Encounter (HOSPITAL_COMMUNITY): Payer: Self-pay | Admitting: Emergency Medicine

## 2016-04-03 ENCOUNTER — Emergency Department (HOSPITAL_COMMUNITY): Payer: Medicaid Other

## 2016-04-03 ENCOUNTER — Emergency Department (HOSPITAL_COMMUNITY)
Admission: EM | Admit: 2016-04-03 | Discharge: 2016-04-03 | Disposition: A | Discharge status: Home / Self Care | Payer: Medicaid Other | Attending: Emergency Medicine | Admitting: Emergency Medicine

## 2016-04-03 DIAGNOSIS — R55 Syncope and collapse: Secondary | ICD-10-CM | POA: Diagnosis not present

## 2016-04-03 DIAGNOSIS — I10 Essential (primary) hypertension: Secondary | ICD-10-CM | POA: Diagnosis not present

## 2016-04-03 DIAGNOSIS — Z7951 Long term (current) use of inhaled steroids: Secondary | ICD-10-CM | POA: Insufficient documentation

## 2016-04-03 DIAGNOSIS — Z3202 Encounter for pregnancy test, result negative: Secondary | ICD-10-CM | POA: Insufficient documentation

## 2016-04-03 LAB — BASIC METABOLIC PANEL
Anion gap: 11 (ref 5–15)
BUN: 10 mg/dL (ref 6–20)
CO2: 26 mmol/L (ref 22–32)
Calcium: 9.1 mg/dL (ref 8.9–10.3)
Chloride: 103 mmol/L (ref 101–111)
Creatinine, Ser: 0.64 mg/dL (ref 0.44–1.00)
GFR calc Af Amer: >60 mL/min (ref >60)
GFR calc non Af Amer: >60 mL/min (ref >60)
Glucose, Bld: 97 mg/dL (ref 65–99)
Potassium: 3.7 mmol/L (ref 3.5–5.1)
Sodium: 140 mmol/L (ref 135–145)

## 2016-04-03 LAB — CBC
HCT: 32.8 % — ABNORMAL LOW (ref 36.0–46.0)
Hemoglobin: 10.2 g/dL — ABNORMAL LOW (ref 12.0–15.0)
MCH: 22.5 pg — ABNORMAL LOW (ref 26.0–34.0)
MCHC: 31.1 g/dL (ref 30.0–36.0)
MCV: 72.2 fL — ABNORMAL LOW (ref 78.0–100.0)
Platelets: 274 10*3/uL (ref 150–400)
RBC: 4.54 MIL/uL (ref 3.87–5.11)
RDW: 16.3 % — ABNORMAL HIGH (ref 11.5–15.5)
WBC: 6.7 10*3/uL (ref 4.0–10.5)

## 2016-04-03 LAB — URINALYSIS, ROUTINE W REFLEX MICROSCOPIC
Bilirubin Urine: NEGATIVE
Glucose, UA: NEGATIVE mg/dL
Hgb urine dipstick: NEGATIVE
Ketones, ur: NEGATIVE mg/dL
Nitrite: NEGATIVE
Protein, ur: 100 mg/dL — AB
Specific Gravity, Urine: 1.035 — ABNORMAL HIGH (ref 1.005–1.030)
pH: 8 (ref 5.0–8.0)

## 2016-04-03 LAB — URINE MICROSCOPIC-ADD ON: RBC / HPF: NONE SEEN RBC/hpf (ref 0–5)

## 2016-04-03 LAB — PREGNANCY, URINE: Preg Test, Ur: NEGATIVE

## 2016-04-03 LAB — CBG MONITORING, ED: Glucose-Capillary: 96 mg/dL (ref 65–99)

## 2016-04-03 MED ORDER — DIPHENHYDRAMINE HCL 50 MG/ML IJ SOLN
12.5000 mg | Freq: Once | INTRAMUSCULAR | Status: AC
  Administered 2016-04-03: 12.5 mg via INTRAVENOUS
  Filled 2016-04-03: qty 1

## 2016-04-03 MED ORDER — KETOROLAC TROMETHAMINE 15 MG/ML IJ SOLN
15.0000 mg | Freq: Once | INTRAMUSCULAR | Status: AC
  Administered 2016-04-03: 15 mg via INTRAVENOUS
  Filled 2016-04-03: qty 1

## 2016-04-03 MED ORDER — SODIUM CHLORIDE 0.9 % IV BOLUS (SEPSIS)
1000.0000 mL | Freq: Once | INTRAVENOUS | Status: AC
  Administered 2016-04-03: 1000 mL via INTRAVENOUS

## 2016-04-03 MED ORDER — PROCHLORPERAZINE EDISYLATE 5 MG/ML IJ SOLN
10.0000 mg | Freq: Once | INTRAMUSCULAR | Status: AC
  Administered 2016-04-03: 10 mg via INTRAVENOUS
  Filled 2016-04-03: qty 2

## 2016-04-03 NOTE — ED Notes (Signed)
Per pt, states she might have passed out today-states she has been sick for 2 weeks-congestion

## 2016-04-03 NOTE — Discharge Instructions (Signed)

## 2016-04-05 LAB — URINE CULTURE

## 2016-04-11 NOTE — ED Provider Notes (Signed)
CSN: [id number]     Arrival date & time 04/03/16  1745 History   First MD Initiated Contact with Patient 04/03/16 1811     Chief Complaint  Patient presents with  . Loss of Consciousness     (Consider location/radiation/quality/duration/timing/severity/associated sxs/prior Treatment) HPI   40 year old female possible syncopal event. Shortly before arrival she thinks she may have passed out. She was walking when she began to feel dizzy/lightheadedness. She denies vertigo. Slightly nauseated. Went to sit down and thinks she then may have briefly passed out. Denies any preceding pain. No palpitations.No dyspnea. Now feeling better although somewhat tired. She is "sick" for the past several weeks. She describes her URI symptoms with congestion and cough.  Past Medical History  Diagnosis Date  . Hypertension    Past Surgical History  Procedure Laterality Date  . Cesarean section     No family history on file. Social History  Substance Use Topics  . Smoking status: Never Smoker   . Smokeless tobacco: None  . Alcohol Use: No   OB History    No data available     Review of Systems  All systems reviewed and negative, other than as noted in HPI.   Allergies  Macrobid  Home Medications   Prior to Admission medications   Medication Sig Start Date End Date Taking? Authorizing Provider  aspirin-sod bicarb-citric acid (ALKA-SELTZER) 325 MG TBEF tablet Take 325 mg by mouth every 6 (six) hours as needed (cold).   Yes Historical Provider, MD  fluticasone (FLONASE) 50 MCG/ACT nasal spray Place 1 spray into both nostrils daily.   Yes Historical Provider, MD  pseudoephedrine (SUDAFED) 30 MG tablet Take 30 mg by mouth every 4 (four) hours as needed for congestion.   Yes Historical Provider, MD   BP 118/76 mmHg  Pulse 88  Temp(Src) 98.9 F (37.2 C) (Oral)  Resp 19  SpO2 100%  LMP 03/04/2016 Physical Exam  Constitutional: She is oriented to person, place, and time. She appears  well-developed and well-nourished. No distress.  HENT:  Head: Normocephalic and atraumatic.  Eyes: Conjunctivae are normal. Right eye exhibits no discharge. Left eye exhibits no discharge.  Neck: Neck supple.  Cardiovascular: Normal rate, regular rhythm and normal heart sounds.  Exam reveals no gallop and no friction rub.   No murmur heard. Pulmonary/Chest: Effort normal and breath sounds normal. No respiratory distress.  Abdominal: Soft. She exhibits no distension. There is no tenderness.  Musculoskeletal: She exhibits no edema or tenderness.  Lower extremities symmetric as compared to each other. No calf tenderness. Negative Homan's. No palpable cords.   Neurological: She is alert and oriented to person, place, and time. No cranial nerve deficit. She exhibits normal muscle tone. Coordination normal.  Skin: Skin is warm and dry.  Psychiatric: She has a normal mood and affect. Her behavior is normal. Thought content normal.  Nursing note and vitals reviewed.   ED Course  Procedures (including critical care time) Labs Review Labs Reviewed  URINE CULTURE - Abnormal; Notable for the following:    Culture MULTIPLE SPECIES PRESENT, SUGGEST RECOLLECTION (*)    All other components within normal limits  CBC - Abnormal; Notable for the following:    Hemoglobin 10.2 (*)    HCT 32.8 (*)    MCV 72.2 (*)    MCH 22.5 (*)    RDW 16.3 (*)    All other components within normal limits  URINALYSIS, ROUTINE W REFLEX MICROSCOPIC (NOT AT [facility]) - Abnormal; Notable for the  following:    APPearance CLOUDY (*)    Specific Gravity, Urine 1.035 (*)    Protein, ur 100 (*)    Leukocytes, UA SMALL (*)    All other components within normal limits  URINE MICROSCOPIC-ADD ON - Abnormal; Notable for the following:    Squamous Epithelial / LPF 6-30 (*)    Bacteria, UA MANY (*)    All other components within normal limits  BASIC METABOLIC PANEL  PREGNANCY, URINE  CBG MONITORING, ED    Imaging Review No  results found. I have personally reviewed and evaluated these images and lab results as part of my medical decision-making.   EKG Interpretation   Date/Time:  Saturday Apr 03 2016 18:37:25 EDT Ventricular Rate:  85 PR Interval:  128 QRS Duration: 81 QT Interval:  401 QTC Calculation: 477 R Axis:   37 Text Interpretation:  Sinus rhythm Confirmed by Juleen China  MD, Emaad Nanna ([id number])  on 04/03/2016 7:54:44 PM Also confirmed by Juleen China  MD, Atziry Baranski [phone]), editor  [facility], Jola Babinski [phone])  on 04/04/2016 7:44:14 AM      MDM   Final diagnoses:  Syncope and collapse        Raeford Razor, MD 04/11/16 [phone]

## 2016-10-07 IMAGING — CT CT HEAD W/O CM
2 series · 16 of 30 positions shown, 19 images · non-contrast
Comparison: None.

CLINICAL DATA: 39-year-old female with a history of syncope

EXAM:
CT HEAD WITHOUT CONTRAST
TECHNIQUE: Contiguous axial images were obtained from the base of the skull
through the vertex without intravenous contrast.

[Series 2: head w/o · axial · non-contrast · 0.45mm/px · z∈[-169,-49]mm · 9 of 30 slices shown, 12 images]
[im 3/30  brain]
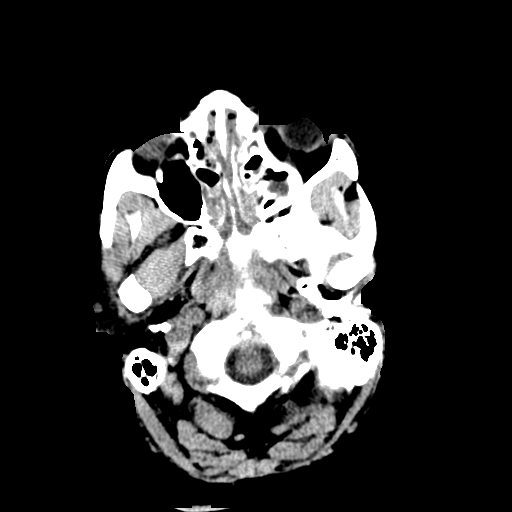
[im 3/30  bone]
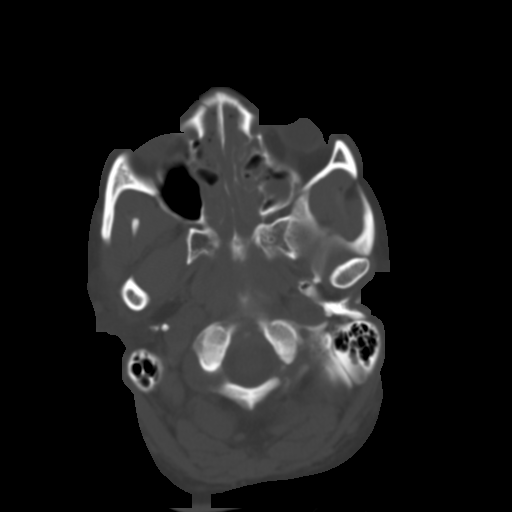
[im 6/30  brain]
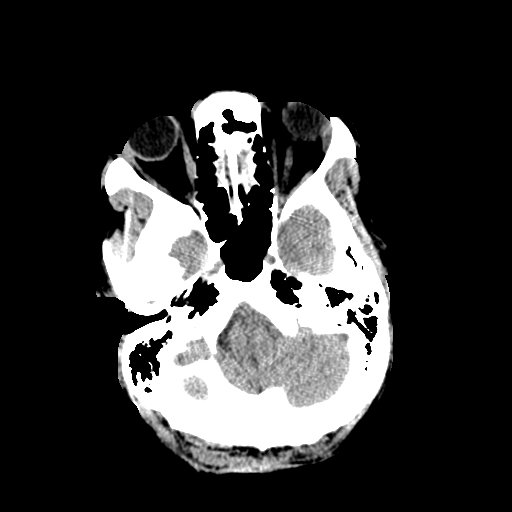
[im 9/30  brain]
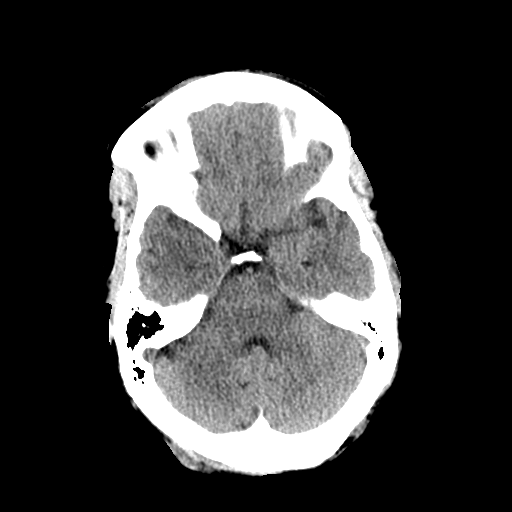
[im 12/30  brain]
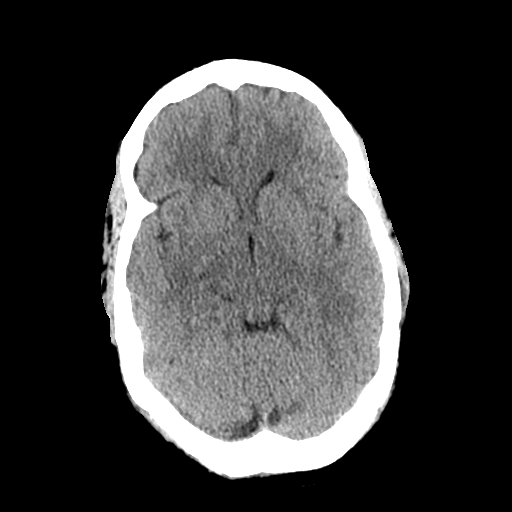
[im 15/30  brain]
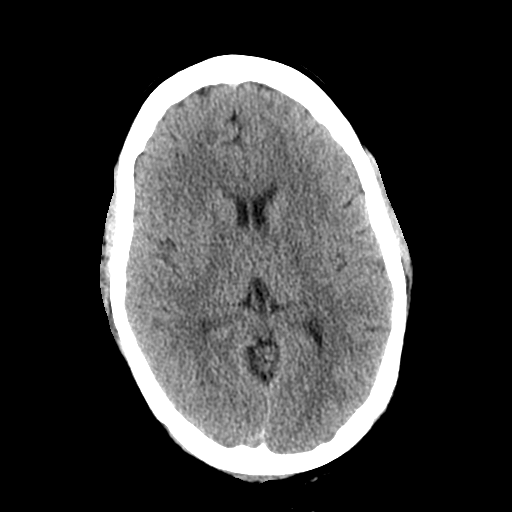
[im 15/30  bone]
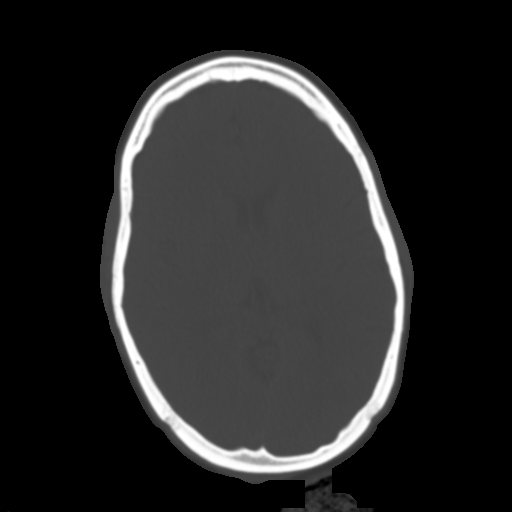
[im 18/30  brain]
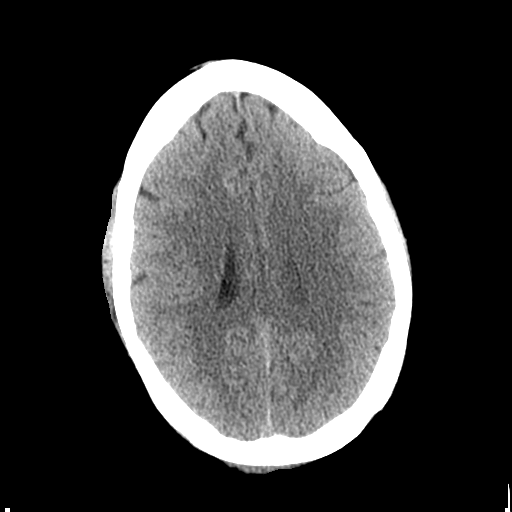
[im 21/30  brain]
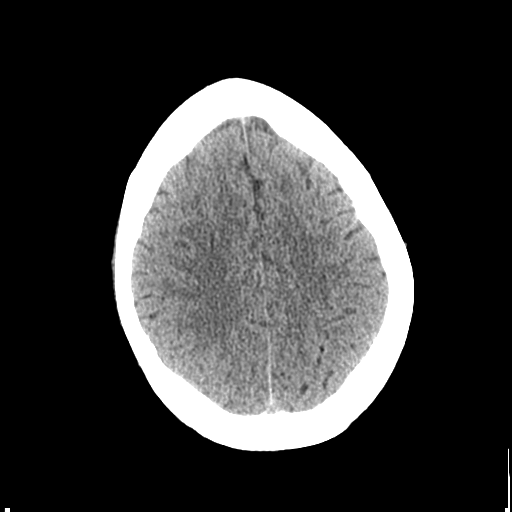
[im 24/30  brain]
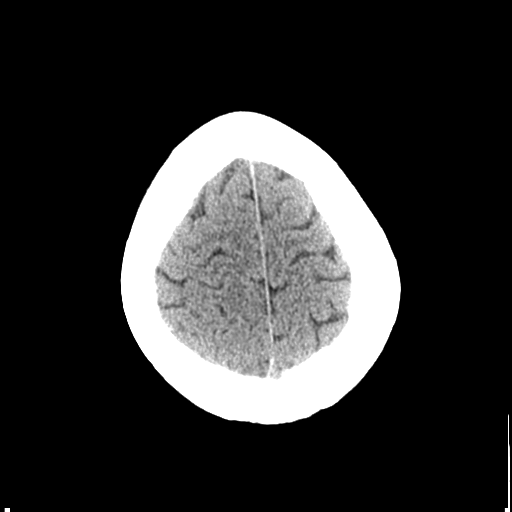
[im 27/30  brain]
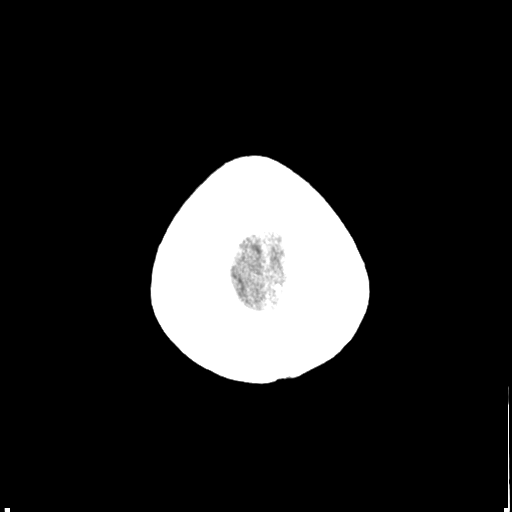
[im 27/30  bone]
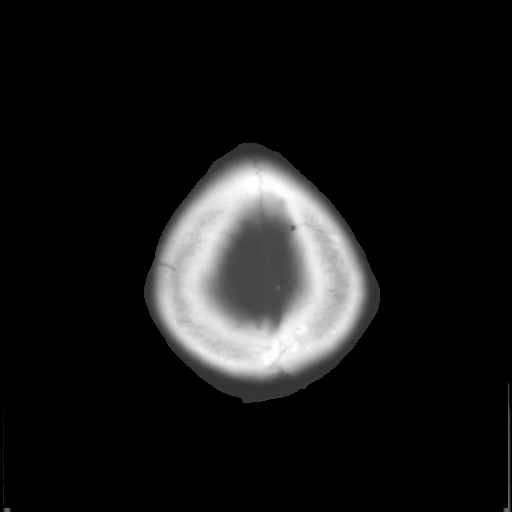

[Series 3: bone windows · axial · 0.45mm/px · z∈[-164,-65]mm · 7 of 50 slices shown]
[im 6/50  bone]
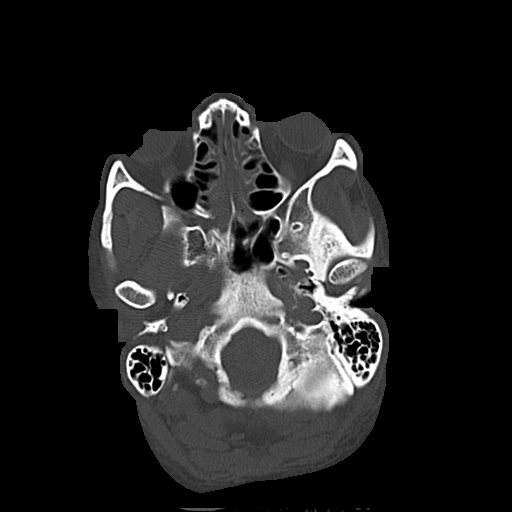
[im 11/50  bone]
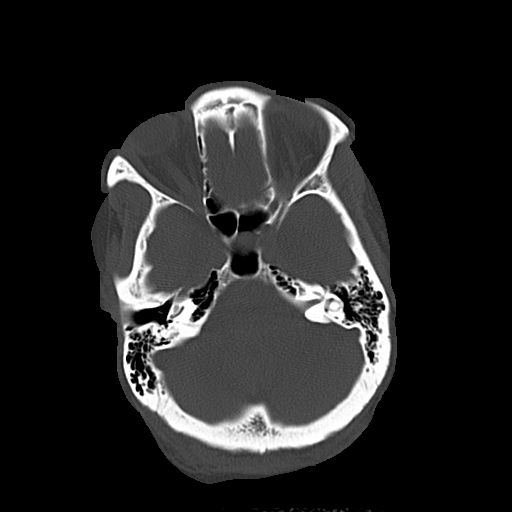
[im 17/50  bone]
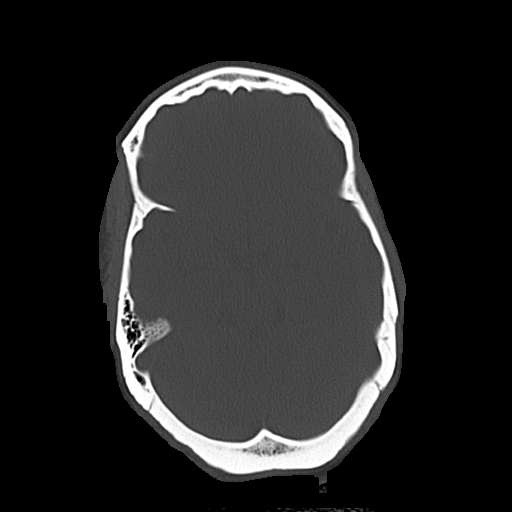
[im 22/50  bone]
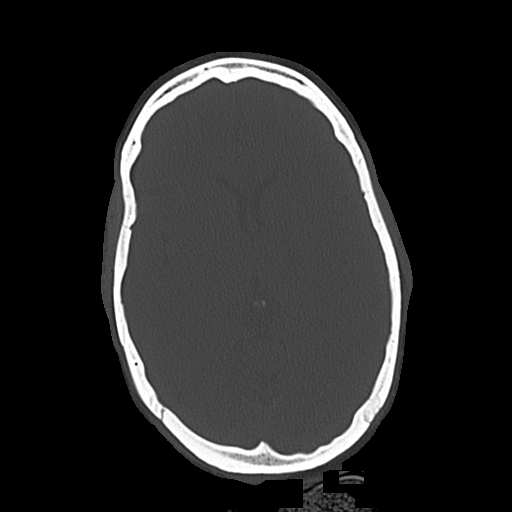
[im 28/50  bone]
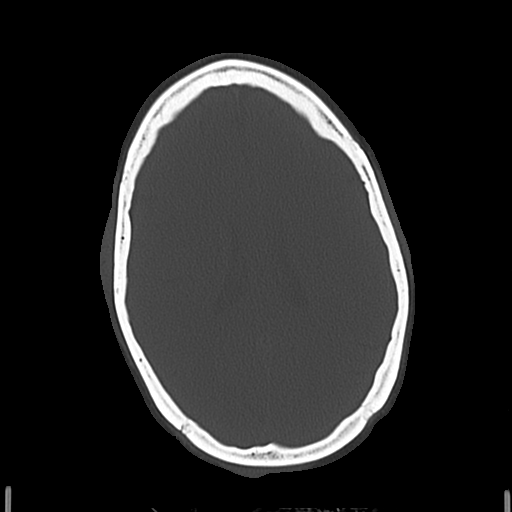
[im 33/50  bone]
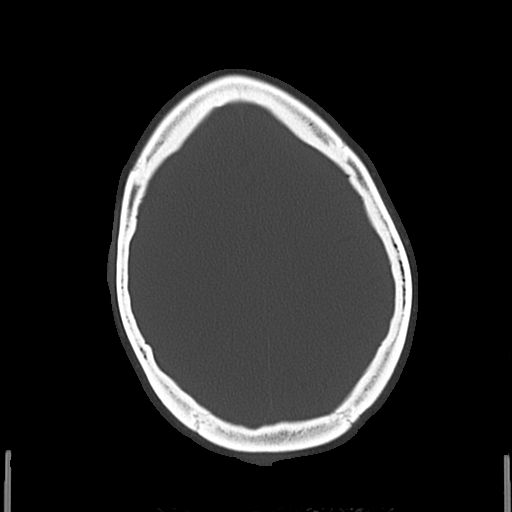
[im 39/50  bone]
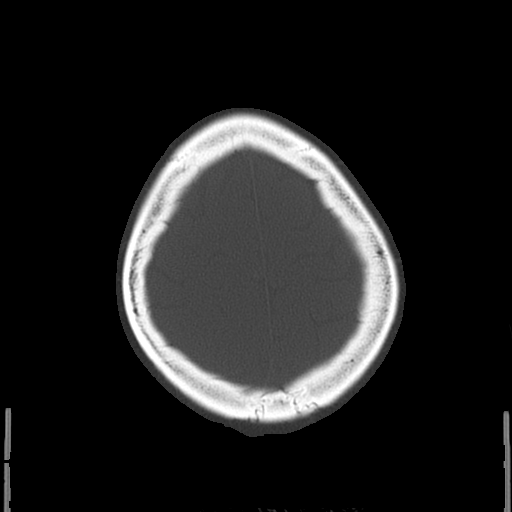

[16 of 30 positions shown; findings below may reference images not displayed]

FINDINGS: Unremarkable appearance of the calvarium without acute fracture or
aggressive lesion.

Soft tissue swelling in the occipital region midline. No radiopaque
foreign body. No underlying fracture.

Unremarkable appearance of the bilateral orbits.

Mastoid air cells are clear.

Air-fluid level of the left maxillary sinus. Fluid filled nasal
canal and partially opacified ethmoid air cells.

No acute intracranial hemorrhage, midline shift, or mass effect.

Gray-white differentiation is maintained, without CT evidence of
acute ischemia.

Unremarkable configuration of the ventricles.
IMPRESSION: No acute intracranial abnormality.

Soft tissue swelling in the occipital soft tissues with no
underlying fracture.

Fluid filled nasal canal and left maxillary sinus, with partial
opacification of ethmoid air cells.

## 2018-02-22 ENCOUNTER — Other Ambulatory Visit: Payer: Self-pay

## 2018-02-22 ENCOUNTER — Encounter (HOSPITAL_COMMUNITY): Payer: Self-pay | Admitting: Emergency Medicine

## 2018-02-22 ENCOUNTER — Emergency Department (HOSPITAL_COMMUNITY)
Admission: EM | Admit: 2018-02-22 | Discharge: 2018-02-22 | Disposition: A | Discharge status: Home / Self Care | Payer: Medicaid Other | Attending: Emergency Medicine | Admitting: Emergency Medicine

## 2018-02-22 DIAGNOSIS — I10 Essential (primary) hypertension: Secondary | ICD-10-CM | POA: Insufficient documentation

## 2018-02-22 DIAGNOSIS — J029 Acute pharyngitis, unspecified: Secondary | ICD-10-CM

## 2018-02-22 DIAGNOSIS — Z79899 Other long term (current) drug therapy: Secondary | ICD-10-CM | POA: Insufficient documentation

## 2018-02-22 LAB — RAPID STREP SCREEN (MED CTR MEBANE ONLY): STREPTOCOCCUS, GROUP A SCREEN (DIRECT): NEGATIVE

## 2018-02-22 NOTE — ED Provider Notes (Signed)
[facility]-EMERGENCY DEPT Provider Note   CSN: [id number] Arrival date & time: 02/22/18  1359     History   Chief Complaint Chief Complaint  Patient presents with  . Sore Throat    HPI Denise Brown is a 42 y.o. female.  HPI  Patient is a 41-year female with a history of hypertension presenting for sore throat.  Patient reports that symptoms began 2 days ago.  Patient reports that she was exposed to someone with strep throat over the weekend.  Patient reports that she had a hoarse voice on Sunday that progressed into a sore throat yesterday.  Patient also reports a nonproductive cough with her symptoms.  No fever or chills.  Patient denies taking any antipyretics.  Patient has been taking Robitussin to assist with cough.  Patient denies any difficulty breathing, obstructive swallowing patterns, neck swelling or induration, sublingual tenderness.  No rashes. Patient denies any history of immunocompromise status.  Past Medical History:  Diagnosis Date  . Hypertension     There are no active problems to display for this patient.   Past Surgical History:  Procedure Laterality Date  . CESAREAN SECTION       OB History   None      Home Medications    Prior to Admission medications   Medication Sig Start Date End Date Taking? Authorizing Provider  aspirin-sod bicarb-citric acid (ALKA-SELTZER) 325 MG TBEF tablet Take 325 mg by mouth every 6 (six) hours as needed (cold).   Yes [provider]  fluticasone (FLONASE) 50 MCG/ACT nasal spray Place 1 spray into both nostrils daily.   Yes [provider]  pseudoephedrine (SUDAFED) 30 MG tablet Take 30 mg by mouth every 4 (four) hours as needed for congestion.   Yes [provider]    Family History No family history on file.  Social History Social History   Tobacco Use  . Smoking status: Never Smoker  Substance Use Topics  . Alcohol use: No  . Drug use: Yes    Frequency:  1.0 times per week    Types: Marijuana     Allergies   Macrobid [nitrofurantoin monohyd macro]   Review of Systems Review of Systems  Constitutional: Negative for chills and fever.  HENT: Positive for sore throat. Negative for congestion, ear pain, rhinorrhea, trouble swallowing and voice change.   Respiratory: Positive for cough.   Gastrointestinal: Positive for nausea. Negative for abdominal pain and vomiting.  Skin: Negative for rash.     Physical Exam Updated Vital Signs BP (!) 151/104 (BP Location: Right Arm)   Pulse 69   Temp 98 F (36.7 C) (Oral)   Resp 16   LMP 02/16/2018   SpO2 100%   Physical Exam  Constitutional: She appears well-developed and well-nourished. No distress.  Sitting comfortably in bed.  HENT:  Head: Normocephalic and atraumatic.  Mouth/Throat: Tonsils are 1+ on the right. Tonsils are 1+ on the left.  Normal phonation. No muffled voice sounds. Patient swallows secretions without difficulty. Dentition normal. No lesions of tongue or buccal mucosa. Uvula midline. No asymmetric swelling of the posterior pharynx.+ Erythema of posterior pharynx. No tonsillar exuduate. Small white patch in groove of right tonsil favoring tonsil stone. No lingual swelling. No induration inferior to tongue. No submandibular tenderness, swelling, or induration.  Tissues of the neck supple. No cervical lymphadenopathy. Right TM without erythema or effusion; left TM without erythema or effusion.  Eyes: Conjunctivae are normal. Right eye exhibits no discharge. Left  eye exhibits no discharge.  EOMs normal to gross examination.  Neck: Normal range of motion.  Cardiovascular: Normal rate, regular rhythm and normal heart sounds.  Pulmonary/Chest: Effort normal and breath sounds normal. She has no wheezes. She has no rales.  Normal respiratory effort. Patient converses comfortably. No audible wheeze or stridor.  Abdominal: She exhibits no distension.  Musculoskeletal: Normal range  of motion.  Neurological: She is alert.  Cranial nerves intact to gross observation. Patient moves extremities without difficulty.  Skin: Skin is warm and dry. She is not diaphoretic.  Psychiatric: She has a normal mood and affect. Her behavior is normal. Judgment and thought content normal.  Nursing note and vitals reviewed.    ED Treatments / Results  Labs (all labs ordered are listed, but only abnormal results are displayed) Labs Reviewed  RAPID STREP SCREEN (NOT AT [facility])  CULTURE, GROUP A STREP [facility]    EKG None  Radiology No results found.  Procedures Procedures (including critical care time)  Medications Ordered in ED Medications - No data to display   Initial Impression / Assessment and Plan / ED Course  I have reviewed the triage vital signs and the nursing notes.  Pertinent labs & imaging results that were available during my care of the patient were reviewed by me and considered in my medical decision making (see chart for details).     Denise Brown is a 42 y.o. female who presents to ED for sore throat. Patient nontoxic appearing and in no acute distress. Rapid strep negative. Suspect viral etiology. Presentation not concerning for PTA or infection spread to soft tissue. No trismus or uvula deviation. Patient with normal phonation. Exam demonstrates soft neck tissue, no swelling or induration inferior to the tongue or in the submandibular space recommend Tylenol and ibuprofen alternating as well as Robitussin for cough per patient's home regimen. Patient able to drink water in ED without difficulty with intact air way.  Patient ambulatory and conversing comfortably at time of discharge.  Specific return precautions discussed for change in voice, inability to tolerate secretions, difficulty breathing or swallowing, or increased nausea or vomiting. Discussed importance of hydration. Recommended PCP follow up. All questions answered.  Of note, blood pressure is  elevated today.  Patient is not on antihypertensives.  Patient reports it is frequently elevated when she has had a providers office and has had discussions with her PCP about blood pressure.  Final Clinical Impressions(s) / ED Diagnoses   Final diagnoses:  Sore throat  Acute pharyngitis, unspecified etiology  Elevated blood pressure reading with diagnosis of hypertension    ED Discharge Orders    None       Delia ChimesMurray, Jasyn Mey B, PA-C 02/22/18 1702    Terrilee FilesButler, Michael C, MD 02/23/18 1234

## 2018-02-22 NOTE — Discharge Instructions (Addendum)
Please read and follow all provided instructions.  Your diagnoses today include:  1. Sore throat   2. Acute pharyngitis, unspecified etiology   3. Elevated blood pressure reading with diagnosis of hypertension     Tests performed today include: Strep test: was negative for strep throat Strep culture: you will be notified if this comes back positive Vital signs. See below for your results today.   Medications prescribed:   You may take ibuprofen mg every 6 hours as needed for pain. If still requiring this medication around the clock for acute pain after 10 days, please see your primary healthcare provider.  Women who are pregnant, breastfeeding, or planning on becoming pregnant should not take non-steroidal anti-inflammatories such as   You may combine this medication with Tylenol, 650 mg every 6 hours, so you are receiving something for pain every 3 hours.  This is not a long-term medication unless under the care and direction of your primary provider. Taking this medication long-term and not under the supervision of a healthcare provider could increase the risk of stomach ulcers, kidney problems, and cardiovascular problems such as high blood pressure.   Home care instructions:  Please read the educational materials provided and follow any instructions contained in this packet.  Follow-up instructions: Please follow-up with your primary care provider as needed for further evaluation of your symptoms.  Return instructions:  Please return to the Emergency Department if you experience worsening symptoms.  Return if you have worsening problems swallowing, your neck becomes swollen, you cannot swallow your saliva or your voice becomes muffled.  Return with high persistent fever, persistent vomiting, or if you have trouble breathing.  Please return if you have any other emergent concerns.  Additional Information:  Your vital signs today were: BP (!) 151/104 (BP Location: Right Arm)     Pulse 69    Temp 98 F (36.7 C) (Oral)    Resp 16    LMP 02/16/2018    SpO2 100%  If your blood pressure (BP) was elevated above 135/85 this visit, please have this repeated by your doctor within one month. --------------

## 2018-02-22 NOTE — ED Triage Notes (Signed)
Pt complaint of sore throat x3 days; contact with someone with strep.

## 2018-02-24 LAB — CULTURE, GROUP A STREP (THRC)

## 2024-07-24 ENCOUNTER — Encounter (HOSPITAL_COMMUNITY): Payer: Self-pay | Admitting: Emergency Medicine

## 2024-07-24 ENCOUNTER — Emergency Department (HOSPITAL_COMMUNITY)

## 2024-07-24 ENCOUNTER — Emergency Department (HOSPITAL_COMMUNITY): Admission: EM | Admit: 2024-07-24 | Discharge: 2024-07-24 | Disposition: A | Discharge status: Home / Self Care

## 2024-07-24 ENCOUNTER — Other Ambulatory Visit: Payer: Self-pay

## 2024-07-24 DIAGNOSIS — I10 Essential (primary) hypertension: Secondary | ICD-10-CM | POA: Diagnosis not present

## 2024-07-24 DIAGNOSIS — R0789 Other chest pain: Secondary | ICD-10-CM | POA: Insufficient documentation

## 2024-07-24 DIAGNOSIS — R079 Chest pain, unspecified: Secondary | ICD-10-CM

## 2024-07-24 DIAGNOSIS — K769 Liver disease, unspecified: Secondary | ICD-10-CM | POA: Insufficient documentation

## 2024-07-24 LAB — CBC
HCT: 30.8 % — ABNORMAL LOW (ref 36.0–46.0)
Hemoglobin: 9.9 g/dL — ABNORMAL LOW (ref 12.0–15.0)
MCH: 26.8 pg (ref 26.0–34.0)
MCHC: 32.1 g/dL (ref 30.0–36.0)
MCV: 83.2 fL (ref 80.0–100.0)
Platelets: 352 K/uL (ref 150–400)
RBC: 3.7 MIL/uL — ABNORMAL LOW (ref 3.87–5.11)
RDW: 14.3 % (ref 11.5–15.5)
WBC: 4.1 K/uL (ref 4.0–10.5)
nRBC: 0.5 % — ABNORMAL HIGH (ref 0.0–0.2)

## 2024-07-24 LAB — BASIC METABOLIC PANEL WITH GFR
Anion gap: 13 (ref 5–15)
BUN: 9 mg/dL (ref 6–20)
CO2: 24 mmol/L (ref 22–32)
Calcium: 9.1 mg/dL (ref 8.9–10.3)
Chloride: 103 mmol/L (ref 98–111)
Creatinine, Ser: 0.58 mg/dL (ref 0.44–1.00)
GFR, Estimated: >60 mL/min (ref >60)
Glucose, Bld: 91 mg/dL (ref 70–99)
Potassium: 3.7 mmol/L (ref 3.5–5.1)
Sodium: 139 mmol/L (ref 135–145)

## 2024-07-24 LAB — HEPATIC FUNCTION PANEL
ALT: 19 U/L (ref 0–44)
AST: 27 U/L (ref 15–41)
Albumin: 4 g/dL (ref 3.5–5.0)
Alkaline Phosphatase: 99 U/L (ref 38–126)
Bilirubin, Direct: <0.1 mg/dL (ref 0.0–0.2)
Total Bilirubin: 0.3 mg/dL (ref 0.0–1.2)
Total Protein: 6.8 g/dL (ref 6.5–8.1)

## 2024-07-24 LAB — LIPASE, BLOOD: Lipase: 20 U/L (ref 11–51)

## 2024-07-24 LAB — TROPONIN T, HIGH SENSITIVITY
Troponin T High Sensitivity: <15 ng/L (ref 0–19)
Troponin T High Sensitivity: <15 ng/L (ref 0–19)

## 2024-07-24 LAB — HCG, SERUM, QUALITATIVE: Preg, Serum: NEGATIVE

## 2024-07-24 MED ORDER — SODIUM CHLORIDE (PF) 0.9 % IJ SOLN
INTRAMUSCULAR | Status: AC
  Filled 2024-07-24: qty 50

## 2024-07-24 MED ORDER — IOHEXOL 350 MG/ML SOLN
80.0000 mL | Freq: Once | INTRAVENOUS | Status: AC | PRN
  Administered 2024-07-24: 80 mL via INTRAVENOUS

## 2024-07-24 NOTE — ED Triage Notes (Signed)
 48 y/o female comes in c/o mid sternal chest pain that started yesterday.

## 2024-07-24 NOTE — Discharge Instructions (Signed)
 Your CT scan showed a indeterminate lesion in your liver, it is unlikely causing your symptoms today, but it does require further evaluation.  As such you should follow-up with your primary doctor as soon as possible for follow-up imaging with MRI.

## 2024-07-24 NOTE — ED Notes (Signed)
 Paramedic informed of pts BP reading. Pt states that she has not been taking her BP medication.

## 2024-07-24 NOTE — ED Notes (Addendum)
 Pt is resting.

## 2024-07-24 NOTE — ED Provider Notes (Signed)
 [place] [facility] AT [facility] Provider Note   CSN: [id number] Arrival date & time: 07/24/24  1104     Patient presents with: Chest Pain   Denise Brown is a 48 y.o. female.   Is a 48 year old female presenting emergency department with chest pain.  Reports she went on a long walk yesterday with no symptoms, today at work had sudden onset chest discomfort with tingling/paresthesia to her right shoulder and headache.  Does not typically have headaches, no vision loss, facial droop, unilateral weakness.  Reports headache has improved currently 1 out of 10.  Continues to have chest discomfort.  No prior cardiac history.  Does not smoke.  Does have a history of hypertension, but has not been taking her antihypertensives for some time.   Chest Pain      Prior to Admission medications   Medication Sig Start Date End Date Taking? Authorizing Provider  aspirin -sod bicarb-citric acid (ALKA-SELTZER) 325 MG TBEF tablet Take 325 mg by mouth every 6 (six) hours as needed (cold).    [provider]  fluticasone (FLONASE) 50 MCG/ACT nasal spray Place 1 spray into both nostrils daily.    [provider]  pseudoephedrine (SUDAFED) 30 MG tablet Take 30 mg by mouth every 4 (four) hours as needed for congestion.    [provider]    Allergies: Macrobid  [nitrofurantoin  monohyd macro]    Review of Systems  Cardiovascular:  Positive for chest pain.    Updated Vital Signs BP (!) 176/104 (BP Location: Left Arm)   Pulse 79   Temp 97.7 F (36.5 C) (Oral)   Resp 18   Ht 5' 3 (1.6 m)   Wt 111.7 kg   SpO2 100%   BMI 43.62 kg/m   Physical Exam Vitals and nursing note reviewed.  Constitutional:      Appearance: She is obese.  HENT:     Head: Normocephalic.  Cardiovascular:     Rate and Rhythm: Normal rate.     Pulses:          Radial pulses are 2+ on the right side and 2+ on the left side.       Dorsalis pedis pulses are 2+ on the right  side and 2+ on the left side.     Heart sounds: Normal heart sounds.  Pulmonary:     Breath sounds: Normal breath sounds.  Abdominal:     Palpations: Abdomen is soft.  Musculoskeletal:     Right lower leg: No edema.     Left lower leg: No edema.  Skin:    General: Skin is warm.     Capillary Refill: Capillary refill takes less than 2 seconds.  Neurological:     General: No focal deficit present.     Mental Status: She is alert and oriented to person, place, and time.     (all labs ordered are listed, but only abnormal results are displayed) Labs Reviewed  CBC - Abnormal; Notable for the following components:      Result Value   RBC 3.70 (*)    Hemoglobin 9.9 (*)    HCT 30.8 (*)    nRBC 0.5 (*)    All other components within normal limits  BASIC METABOLIC PANEL WITH GFR  HCG, SERUM, QUALITATIVE  HEPATIC FUNCTION PANEL  LIPASE, BLOOD  TROPONIN T, HIGH SENSITIVITY  TROPONIN T, HIGH SENSITIVITY    EKG: EKG Interpretation Date/Time:  Tuesday July 24 2024 12:14:15 EDT Ventricular Rate:  74 PR  Interval:  145 QRS Duration:  88 QT Interval:  441 QTC Calculation: 490 R Axis:   17  Text Interpretation: Sinus rhythm Borderline T wave abnormalities Borderline prolonged QT interval Confirmed by Neysa Clap [phone]) on 07/24/2024 12:52:07 PM  Radiology: DG Chest 2 View Result Date: 07/24/2024 EXAM: 2 VIEW(S) XRAY OF THE CHEST 07/24/2024 12:33:00 PM COMPARISON: 09/16/2015 CLINICAL HISTORY: Chest pain. FINDINGS: LUNGS AND PLEURA: No focal pulmonary opacity. No pulmonary edema. No pleural effusion. No pneumothorax. HEART AND MEDIASTINUM: No acute abnormality of the cardiac and mediastinal silhouettes. BONES AND SOFT TISSUES: No acute osseous abnormality. IMPRESSION: 1. No acute process. Electronically signed by: Waddell Calk MD 07/24/2024 01:06 PM EDT RP Workstation: [id number]     Procedures   Medications Ordered in the ED  iohexol  (OMNIPAQUE ) 350 MG/ML injection 80 mL (80  mLs Intravenous Contrast Given 07/24/24 1401)    Clinical Course as of 07/24/24 1514  Tue Jul 24, 2024  1512 CT Angio Chest Aorta W and/or Wo Contrast Do not appreciate obvious dissection on my independent review of images.  Nor do I appreciate large saddle pulmonary embolism. [TY]  1513 Troponin T High Sensitivity: <15 [TY]  1513 Preg, Serum: NEGATIVE [TY]  1513 Basic metabolic panel No significant metabolic derangements. [TY]  1513 CBC(!) Not consistent with infectious etiology.  Hemoglobin stable compared to prior labs. [TY]  1513 CBC(!) [TY]  1514 Care signed out to afternoon team disposition pending CTA and delta troponin. [TY]    Clinical Course User Index [TY] Neysa Clap PARAS, DO                                 Medical Decision Making Is a 48 year old female presenting emergency department with chest pain.  She is afebrile, mildly elevated heart rate in triage, but normalized.  Hypertensive 189/118 on arrival and has since improved.  Maintaining oxygen saturation on room air.  Does not appear to be in overt respiratory distress.  Her EKG amlodipine  review without STEMI.  Chest x-ray without pneumonia or pneumothorax.  Will get CTA given her complaint of some paresthesia to her right shoulder and headache in the setting of her chest pain to exclude aortic dissection.  However overall lower suspicion given equal pulses and chest x-ray on my independent review without significant widened mediastinum.  Offered pain medications, but patient declined.  See course for further MDM disposition.  Amount and/or Complexity of Data Reviewed External Data Reviewed:     Details: History of hypertension.  No prior cardiac provocative testing on record. Labs: ordered. Decision-making details documented in ED Course.    Details: ED course Radiology: ordered and independent interpretation performed. Decision-making details documented in ED Course.    Details: See above ECG/medicine tests: ordered  and independent interpretation performed.    Details: See above  Risk Prescription drug management. Decision regarding hospitalization. Diagnosis or treatment significantly limited by social determinants of health. Risk Details: Poor health literacy: Medication noncompliance.      Final diagnoses:  None    ED Discharge Orders     None          Neysa Clap PARAS, DO 07/24/24 1514
